# Patient Record
Sex: Male | Born: 1945 | Race: White | Hispanic: No | Smoking: Former smoker
Health system: Southern US, Community
[De-identification: ages and names within clinical notes are randomized; demographics above are authoritative.]

## PROBLEM LIST (undated history)

## (undated) DIAGNOSIS — I2510 Atherosclerotic heart disease of native coronary artery without angina pectoris: Secondary | ICD-10-CM

## (undated) DIAGNOSIS — N189 Chronic kidney disease, unspecified: Secondary | ICD-10-CM

## (undated) DIAGNOSIS — I4891 Unspecified atrial fibrillation: Secondary | ICD-10-CM

## (undated) DIAGNOSIS — I872 Venous insufficiency (chronic) (peripheral): Secondary | ICD-10-CM

## (undated) DIAGNOSIS — I509 Heart failure, unspecified: Secondary | ICD-10-CM

## (undated) DIAGNOSIS — I472 Ventricular tachycardia, unspecified: Secondary | ICD-10-CM

## (undated) DIAGNOSIS — D696 Thrombocytopenia, unspecified: Secondary | ICD-10-CM

## (undated) DIAGNOSIS — I10 Essential (primary) hypertension: Secondary | ICD-10-CM

## (undated) DIAGNOSIS — M1990 Unspecified osteoarthritis, unspecified site: Secondary | ICD-10-CM

## (undated) DIAGNOSIS — N200 Calculus of kidney: Secondary | ICD-10-CM

## (undated) DIAGNOSIS — E119 Type 2 diabetes mellitus without complications: Secondary | ICD-10-CM

## (undated) DIAGNOSIS — G4733 Obstructive sleep apnea (adult) (pediatric): Secondary | ICD-10-CM

## (undated) DIAGNOSIS — E785 Hyperlipidemia, unspecified: Secondary | ICD-10-CM

## (undated) HISTORY — DX: Type 2 diabetes mellitus without complications: E11.9

## (undated) HISTORY — DX: Unspecified atrial fibrillation: I48.91

## (undated) HISTORY — DX: Atherosclerotic heart disease of native coronary artery without angina pectoris: I25.10

## (undated) HISTORY — PX: KNEE SURGERY: SHX244

## (undated) HISTORY — DX: Essential (primary) hypertension: I10

## (undated) HISTORY — DX: Morbid (severe) obesity due to excess calories: E66.01

## (undated) HISTORY — DX: Venous insufficiency (chronic) (peripheral): I87.2

## (undated) HISTORY — DX: Heart failure, unspecified: I50.9

## (undated) HISTORY — DX: Hyperlipidemia, unspecified: E78.5

## (undated) HISTORY — DX: Thrombocytopenia, unspecified: D69.6

## (undated) HISTORY — DX: Calculus of kidney: N20.0

## (undated) HISTORY — DX: Obstructive sleep apnea (adult) (pediatric): G47.33

## (undated) HISTORY — DX: Unspecified osteoarthritis, unspecified site: M19.90

## (undated) HISTORY — DX: Chronic kidney disease, unspecified: N18.9

---

## 2003-08-29 HISTORY — PX: CARDIAC DEFIBRILLATOR PLACEMENT: SHX171

## 2006-04-25 ENCOUNTER — Emergency Department: Payer: Self-pay | Admitting: Emergency Medicine

## 2006-04-25 ENCOUNTER — Other Ambulatory Visit: Payer: Self-pay

## 2007-01-11 ENCOUNTER — Emergency Department: Payer: Self-pay | Admitting: Emergency Medicine

## 2007-01-11 ENCOUNTER — Other Ambulatory Visit: Payer: Self-pay

## 2007-07-16 ENCOUNTER — Ambulatory Visit: Payer: Self-pay | Admitting: Family

## 2008-02-20 ENCOUNTER — Ambulatory Visit: Payer: Self-pay | Admitting: Family

## 2008-08-19 ENCOUNTER — Ambulatory Visit: Payer: Self-pay | Admitting: Family

## 2008-11-12 ENCOUNTER — Ambulatory Visit: Payer: Self-pay | Admitting: Family

## 2010-06-10 ENCOUNTER — Emergency Department: Payer: Self-pay | Admitting: Emergency Medicine

## 2011-08-01 IMAGING — US ABDOMEN ULTRASOUND
1 series · 17 of 25 positions shown · non-contrast
Comparison: none

REASON FOR EXAM: epigastric and RUQ pain
COMMENTS:   May transport without cardiac monitor

PROCEDURE:     US  - US ABDOMEN GENERAL SURVEY  - June 10, 2010 [DATE]
RESULT:     Comparison: None
TECHNIQUE: Multiple gray-scale and color-flow Doppler images of the abdomen
are presented for review.

[Series 1: abdomen ultrasound · 17 of 49 slices shown]
[im 1/49]
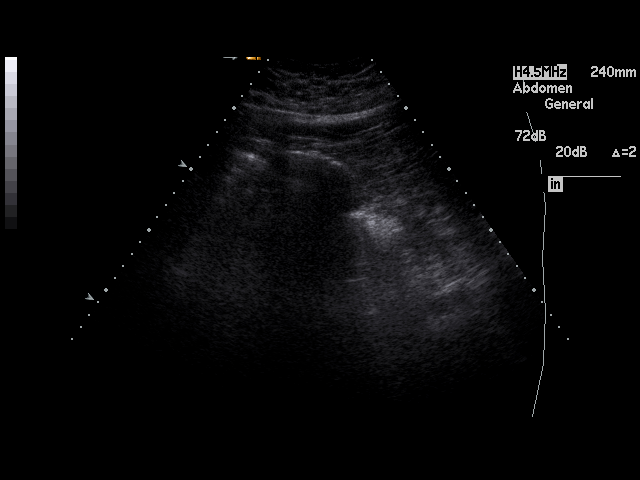
[im 5/49]
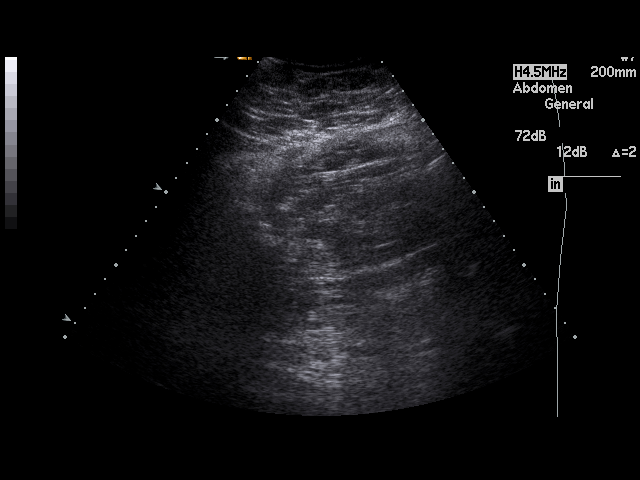
[im 7/49]
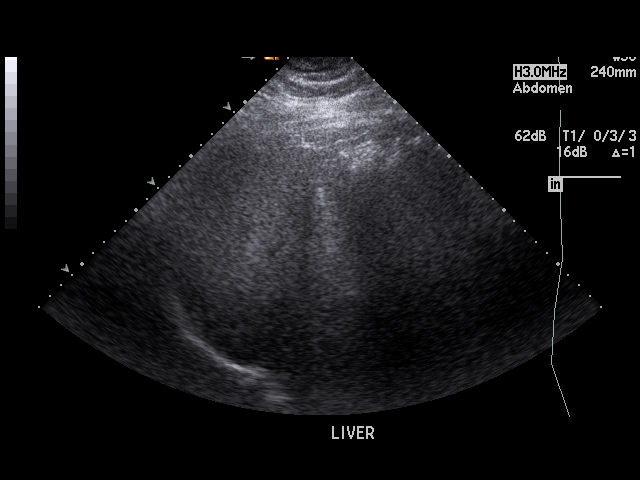
[im 11/49]
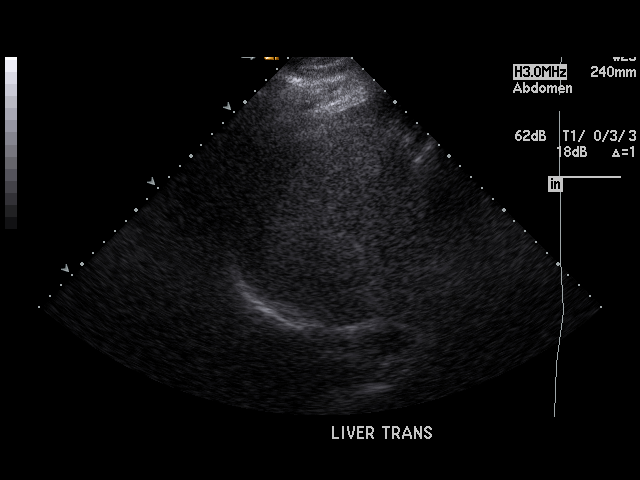
[im 13/49]
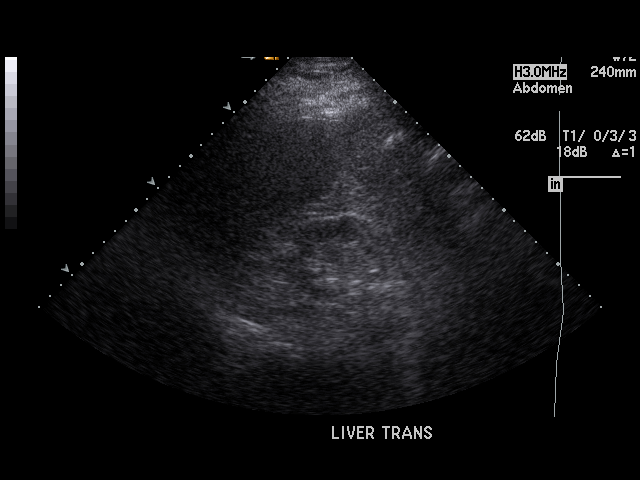
[im 17/49]
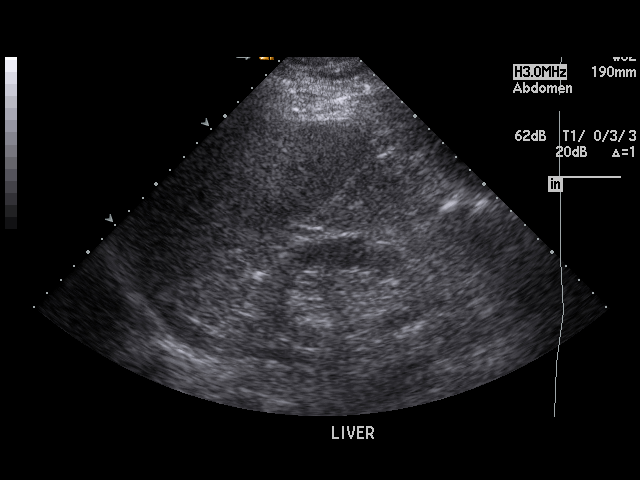
[im 19/49]
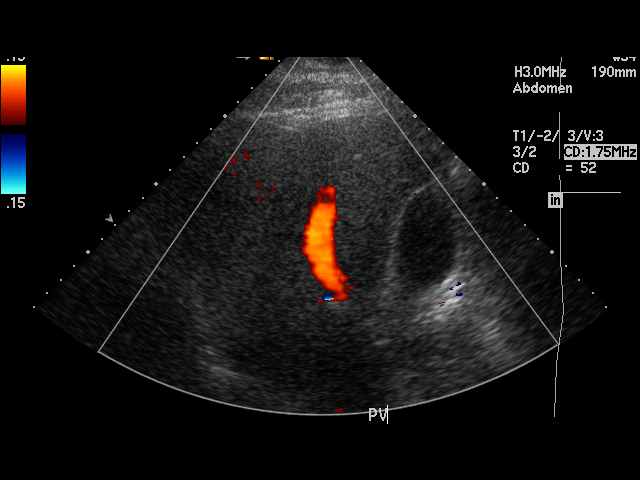
[im 23/49]
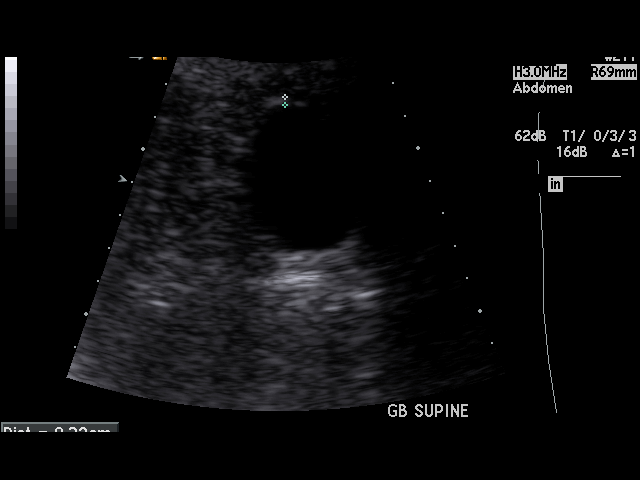
[im 25/49]
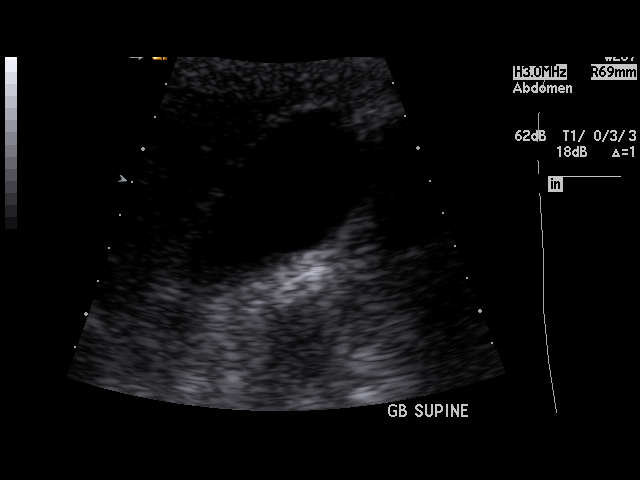
[im 27/49]
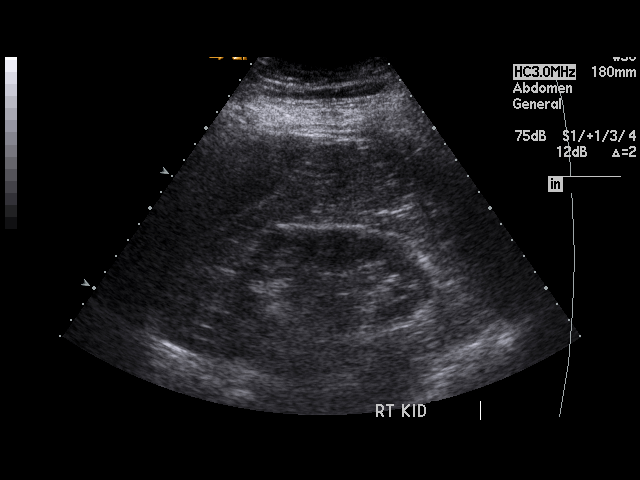
[im 31/49]
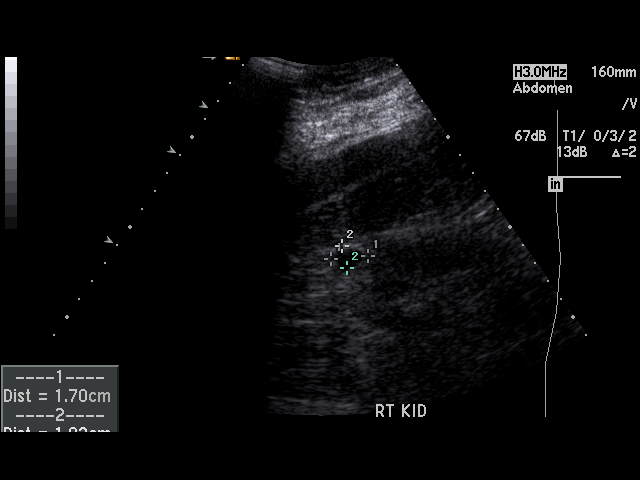
[im 33/49]
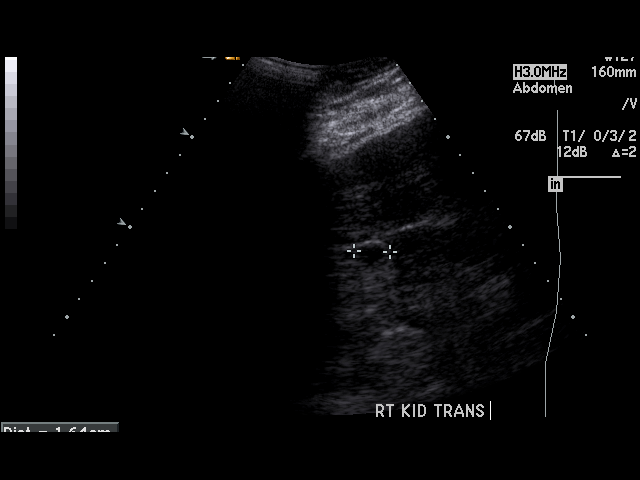
[im 37/49]
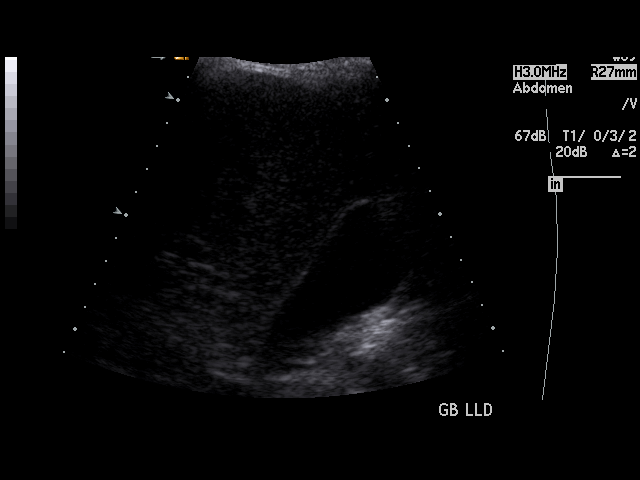
[im 39/49]
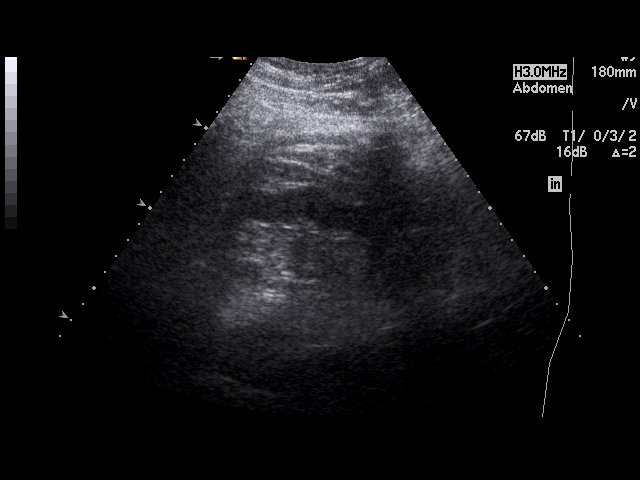
[im 43/49]
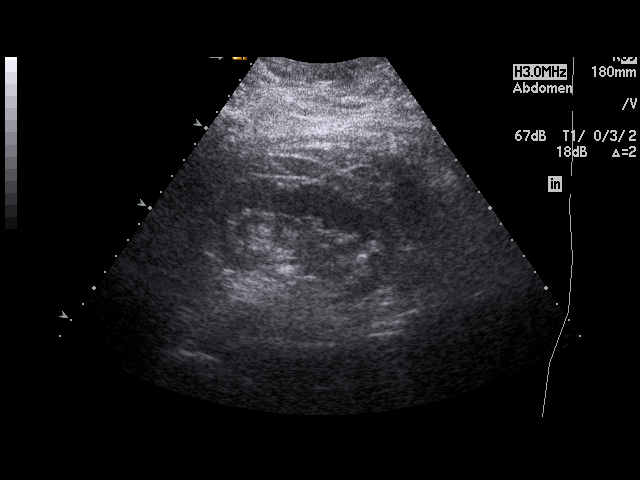
[im 45/49]
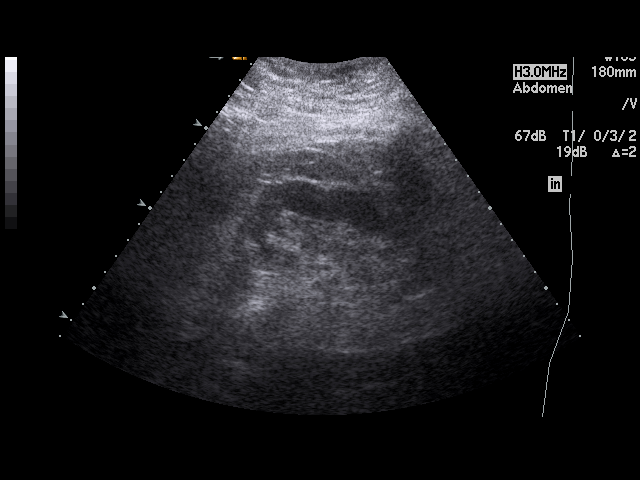
[im 49/49]
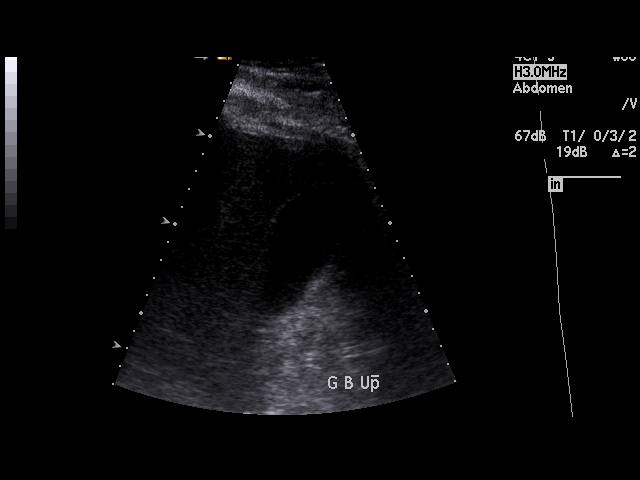

[17 of 25 positions shown; findings below may reference images not displayed]

FINDINGS: The examination is limited by body habitus.

The liver is suboptimally visualized secondary to body habitus. There is no
gross abnormality.

There is no cholelithiasis or biliary sludge. There is no intra- or
extrahepatic biliary ductal dilatation. The common duct measures 3.4 mm in
maximal diameter. There is no gallbladder wall thickening, pericholecystic
fluid, or sonographic Murphy's sign.

The pancreas is not visualized secondary to body habitus. The spleen is
unremarkable. Bilateral kidneys are normal in echogenicity and size. The
right kidney measures 11.1 x 4.5 x 5.5 cm. The left kidney measures 10.2 x
5.4 x 5 cm. There is a nonobstructing left renal calculus. There is no
obstructive uropathy. There is an anechoic 17 mm right renal mass with
increased through transmission most consistent with a cyst.  The abdominal
aorta and IVC are not visualized secondary to body habitus..
IMPRESSION: Limited examination of the abdomen secondary to body habitus.

1. Nonobstructing left nephrolithiasis.
2. No cholelithiasis.

## 2013-06-02 ENCOUNTER — Encounter: Payer: Self-pay | Admitting: Podiatry

## 2013-06-02 ENCOUNTER — Ambulatory Visit (INDEPENDENT_AMBULATORY_CARE_PROVIDER_SITE_OTHER): Payer: Medicare Other | Admitting: Podiatry

## 2013-06-02 VITALS — BP 105/68 | HR 72 | Temp 98.6°F | Resp 16 | Ht 69.0 in | Wt 318.0 lb

## 2013-06-02 DIAGNOSIS — M79606 Pain in leg, unspecified: Secondary | ICD-10-CM | POA: Insufficient documentation

## 2013-06-02 DIAGNOSIS — M79609 Pain in unspecified limb: Secondary | ICD-10-CM

## 2013-06-02 DIAGNOSIS — B351 Tinea unguium: Secondary | ICD-10-CM

## 2013-06-02 NOTE — Patient Instructions (Signed)
Follow up with [place] in two months

## 2013-06-02 NOTE — Progress Notes (Signed)
Kirt presents today with a chief complaint of painful toenails one through 5 bilaterally. He states that in the lower cut them. He presents with an antalgic gate and a walker.  Objective: Pulses are palpable but diminished capillary fill time is normal. No ulcerative lesions. Nails are thick yellow and dystrophic clinically mycotic and ingrown. There are painful on palpation as well as debridement.  Assessment: Pain and limp secondary to onychomycosis.  Plan: Debridement nails 1 through 5 bilaterally covered service.

## 2013-08-04 ENCOUNTER — Ambulatory Visit: Payer: Medicare Other | Admitting: Podiatry

## 2013-10-13 ENCOUNTER — Ambulatory Visit (INDEPENDENT_AMBULATORY_CARE_PROVIDER_SITE_OTHER): Payer: Medicare Other | Admitting: Podiatry

## 2013-10-13 VITALS — BP 80/48 | HR 84 | Resp 24 | Ht 64.0 in | Wt 298.0 lb

## 2013-10-13 DIAGNOSIS — M79609 Pain in unspecified limb: Secondary | ICD-10-CM

## 2013-10-13 DIAGNOSIS — B351 Tinea unguium: Secondary | ICD-10-CM

## 2013-10-13 NOTE — Progress Notes (Signed)
Chief complaint is painful toenails one through 5 bilateral.  Objective: Pulses are strongly palpable bilateral. Nails are thick yellow dystrophic and mycotic bilateral.  Assessment: Pain in limb secondary to onychomycosis 1 through 5 bilateral.  Plan: Debridement of nails 1 through 5 bilateral covered service secondary to pain

## 2014-01-12 ENCOUNTER — Ambulatory Visit (INDEPENDENT_AMBULATORY_CARE_PROVIDER_SITE_OTHER): Payer: Medicare Other | Admitting: Podiatry

## 2014-01-12 ENCOUNTER — Encounter: Payer: Self-pay | Admitting: Podiatry

## 2014-01-12 VITALS — BP 118/86 | HR 92 | Resp 20

## 2014-01-12 DIAGNOSIS — M79609 Pain in unspecified limb: Secondary | ICD-10-CM

## 2014-01-12 DIAGNOSIS — B351 Tinea unguium: Secondary | ICD-10-CM

## 2014-01-12 NOTE — Progress Notes (Signed)
He presents today with a chief complaint of painful elongated toenails one through 5 bilateral. He states tha a hang  in his socks  Objective: Nails are thick yellow dystrophic onychomycotic pulses are palpable bilateral. Edema bilateral.  Assessment: Pain in limb secondary to edema as well as to onychomycosis.  Plan: Debridement of nails in thickness and length as cover service secondary to pain.

## 2014-04-13 ENCOUNTER — Ambulatory Visit: Payer: Medicare Other | Admitting: Podiatry

## 2014-04-21 ENCOUNTER — Inpatient Hospital Stay: Payer: Self-pay | Admitting: Internal Medicine

## 2014-04-21 LAB — CBC WITH DIFFERENTIAL/PLATELET
Basophil #: 0.1 10*3/uL (ref 0.0–0.1)
Basophil %: 0.6 %
EOS ABS: 0 10*3/uL (ref 0.0–0.7)
Eosinophil %: 0.6 %
HCT: 44.6 % (ref 40.0–52.0)
HGB: 14 g/dL (ref 13.0–18.0)
LYMPHS ABS: 1.1 10*3/uL (ref 1.0–3.6)
Lymphocyte %: 12.6 %
MCH: 29.1 pg (ref 26.0–34.0)
MCHC: 31.5 g/dL — ABNORMAL LOW (ref 32.0–36.0)
MCV: 93 fL (ref 80–100)
MONO ABS: 0.9 x10 3/mm (ref 0.2–1.0)
Monocyte %: 10.2 %
Neutrophil #: 6.5 10*3/uL (ref 1.4–6.5)
Neutrophil %: 76 %
Platelet: 38 10*3/uL — ABNORMAL LOW (ref 150–440)
RBC: 4.81 10*6/uL (ref 4.40–5.90)
RDW: 18.2 % — ABNORMAL HIGH (ref 11.5–14.5)
WBC: 8.6 10*3/uL (ref 3.8–10.6)

## 2014-04-21 LAB — COMPREHENSIVE METABOLIC PANEL
ALT: 38 U/L
AST: 46 U/L — AB (ref 15–37)
Albumin: 3 g/dL — ABNORMAL LOW (ref 3.4–5.0)
Alkaline Phosphatase: 115 U/L
Anion Gap: 11 (ref 7–16)
BILIRUBIN TOTAL: 1.5 mg/dL — AB (ref 0.2–1.0)
BUN: 40 mg/dL — ABNORMAL HIGH (ref 7–18)
Calcium, Total: 9.6 mg/dL (ref 8.5–10.1)
Chloride: 90 mmol/L — ABNORMAL LOW (ref 98–107)
Co2: 30 mmol/L (ref 21–32)
Creatinine: 2.26 mg/dL — ABNORMAL HIGH (ref 0.60–1.30)
EGFR (African American): 33 — ABNORMAL LOW
GFR CALC NON AF AMER: 28 — AB
GLUCOSE: 178 mg/dL — AB (ref 65–99)
Osmolality: 277 (ref 275–301)
POTASSIUM: 5.8 mmol/L — AB (ref 3.5–5.1)
SODIUM: 131 mmol/L — AB (ref 136–145)
TOTAL PROTEIN: 6.9 g/dL (ref 6.4–8.2)

## 2014-04-21 LAB — CK TOTAL AND CKMB (NOT AT ARMC)
CK, Total: 64 U/L
CK, Total: 58 U/L
CK-MB: 5.8 ng/mL — ABNORMAL HIGH (ref 0.5–3.6)
CK-MB: 5.4 ng/mL — ABNORMAL HIGH (ref 0.5–3.6)

## 2014-04-21 LAB — URINALYSIS, COMPLETE
BACTERIA: NONE SEEN
Bilirubin,UR: NEGATIVE
Blood: NEGATIVE
GLUCOSE, UR: NEGATIVE mg/dL (ref 0–75)
Ketone: NEGATIVE
Leukocyte Esterase: NEGATIVE
Nitrite: NEGATIVE
Ph: 7 (ref 4.5–8.0)
Protein: NEGATIVE
RBC,UR: 1 /HPF (ref 0–5)
Specific Gravity: 1.011 (ref 1.003–1.030)
Squamous Epithelial: <1
WBC UR: 4 /HPF (ref 0–5)

## 2014-04-21 LAB — PROTIME-INR
INR: 2.8
PROTHROMBIN TIME: 29.1 s — AB (ref 11.5–14.7)

## 2014-04-21 LAB — TROPONIN I
TROPONIN-I: 0.12 ng/mL — AB
Troponin-I: 0.13 ng/mL — ABNORMAL HIGH
Troponin-I: 0.13 ng/mL — ABNORMAL HIGH

## 2014-04-21 LAB — POTASSIUM: POTASSIUM: 4.1 mmol/L (ref 3.5–5.1)

## 2014-04-22 LAB — CBC WITH DIFFERENTIAL/PLATELET
Basophil #: 0 10*3/uL (ref 0.0–0.1)
Basophil %: 0.5 %
Eosinophil #: 0.2 10*3/uL (ref 0.0–0.7)
Eosinophil %: 1.9 %
HCT: 40.5 % (ref 40.0–52.0)
HGB: 12.9 g/dL — ABNORMAL LOW (ref 13.0–18.0)
LYMPHS ABS: 1.4 10*3/uL (ref 1.0–3.6)
Lymphocyte %: 17.3 %
MCH: 29.2 pg (ref 26.0–34.0)
MCHC: 31.7 g/dL — ABNORMAL LOW (ref 32.0–36.0)
MCV: 92 fL (ref 80–100)
MONO ABS: 0.7 x10 3/mm (ref 0.2–1.0)
Monocyte %: 9.2 %
NEUTROS PCT: 71.1 %
Neutrophil #: 5.6 10*3/uL (ref 1.4–6.5)
Platelet: 39 10*3/uL — ABNORMAL LOW (ref 150–440)
RBC: 4.41 10*6/uL (ref 4.40–5.90)
RDW: 18.3 % — ABNORMAL HIGH (ref 11.5–14.5)
WBC: 7.8 10*3/uL (ref 3.8–10.6)

## 2014-04-22 LAB — BASIC METABOLIC PANEL
ANION GAP: 11 (ref 7–16)
BUN: 41 mg/dL — ABNORMAL HIGH (ref 7–18)
Calcium, Total: 9.4 mg/dL (ref 8.5–10.1)
Chloride: 92 mmol/L — ABNORMAL LOW (ref 98–107)
Co2: 30 mmol/L (ref 21–32)
Creatinine: 2.14 mg/dL — ABNORMAL HIGH (ref 0.60–1.30)
EGFR (Non-African Amer.): 30 — ABNORMAL LOW
GFR CALC AF AMER: 35 — AB
GLUCOSE: 96 mg/dL (ref 65–99)
OSMOLALITY: 276 (ref 275–301)
POTASSIUM: 3.9 mmol/L (ref 3.5–5.1)
SODIUM: 133 mmol/L — AB (ref 136–145)

## 2014-04-22 LAB — PROTEIN / CREATININE RATIO, URINE
CREATININE, URINE: 76.5 mg/dL (ref 30.0–125.0)
PROTEIN/CREAT. RATIO: 379 mg/g{creat} — AB (ref 0–200)
Protein, Random Urine: 29 mg/dL — ABNORMAL HIGH (ref 0–12)

## 2014-04-22 LAB — TSH: Thyroid Stimulating Horm: 2.72 u[IU]/mL

## 2014-04-22 LAB — PROTIME-INR
INR: 3.1
Prothrombin Time: 31.3 secs — ABNORMAL HIGH (ref 11.5–14.7)

## 2014-04-22 LAB — MAGNESIUM: MAGNESIUM: 2.3 mg/dL

## 2014-04-22 LAB — HEMOGLOBIN A1C: HEMOGLOBIN A1C: 7.7 % — AB (ref 4.2–6.3)

## 2014-04-23 LAB — BASIC METABOLIC PANEL
Anion Gap: 9 (ref 7–16)
BUN: 43 mg/dL — AB (ref 7–18)
Calcium, Total: 9.2 mg/dL (ref 8.5–10.1)
Chloride: 94 mmol/L — ABNORMAL LOW (ref 98–107)
Co2: 29 mmol/L (ref 21–32)
Creatinine: 2.16 mg/dL — ABNORMAL HIGH (ref 0.60–1.30)
EGFR (African American): 35 — ABNORMAL LOW
EGFR (Non-African Amer.): 30 — ABNORMAL LOW
Glucose: 115 mg/dL — ABNORMAL HIGH (ref 65–99)
Osmolality: 276 (ref 275–301)
Potassium: 4.2 mmol/L (ref 3.5–5.1)
Sodium: 132 mmol/L — ABNORMAL LOW (ref 136–145)

## 2014-04-23 LAB — PROTIME-INR
INR: 4.1
PROTHROMBIN TIME: 38.2 s — AB (ref 11.5–14.7)

## 2014-04-24 ENCOUNTER — Other Ambulatory Visit: Payer: Self-pay | Admitting: *Deleted

## 2014-04-24 LAB — UR PROT ELECTROPHORESIS, URINE RANDOM

## 2014-04-24 MED ORDER — HYDROCODONE-ACETAMINOPHEN 5-325 MG PO TABS: 1.0000 | ORAL_TABLET | Freq: Three times a day (TID) | ORAL | Status: DC | PRN

## 2014-04-24 NOTE — Telephone Encounter (Signed)
[facility] 

## 2014-04-27 ENCOUNTER — Non-Acute Institutional Stay (SKILLED_NURSING_FACILITY): Payer: Medicare Other | Admitting: Adult Health

## 2014-04-27 ENCOUNTER — Encounter: Payer: Self-pay | Admitting: Adult Health

## 2014-04-27 DIAGNOSIS — E785 Hyperlipidemia, unspecified: Secondary | ICD-10-CM

## 2014-04-27 DIAGNOSIS — E1159 Type 2 diabetes mellitus with other circulatory complications: Secondary | ICD-10-CM

## 2014-04-27 DIAGNOSIS — G609 Hereditary and idiopathic neuropathy, unspecified: Secondary | ICD-10-CM

## 2014-04-27 DIAGNOSIS — J309 Allergic rhinitis, unspecified: Secondary | ICD-10-CM | POA: Insufficient documentation

## 2014-04-27 DIAGNOSIS — I872 Venous insufficiency (chronic) (peripheral): Secondary | ICD-10-CM | POA: Insufficient documentation

## 2014-04-27 DIAGNOSIS — I4891 Unspecified atrial fibrillation: Secondary | ICD-10-CM

## 2014-04-27 DIAGNOSIS — I482 Chronic atrial fibrillation, unspecified: Secondary | ICD-10-CM

## 2014-04-27 DIAGNOSIS — Z7901 Long term (current) use of anticoagulants: Secondary | ICD-10-CM

## 2014-04-27 DIAGNOSIS — I509 Heart failure, unspecified: Secondary | ICD-10-CM

## 2014-04-27 DIAGNOSIS — K219 Gastro-esophageal reflux disease without esophagitis: Secondary | ICD-10-CM

## 2014-04-27 DIAGNOSIS — I2510 Atherosclerotic heart disease of native coronary artery without angina pectoris: Secondary | ICD-10-CM | POA: Insufficient documentation

## 2014-04-27 DIAGNOSIS — G629 Polyneuropathy, unspecified: Secondary | ICD-10-CM | POA: Insufficient documentation

## 2014-04-27 DIAGNOSIS — I8310 Varicose veins of unspecified lower extremity with inflammation: Secondary | ICD-10-CM

## 2014-04-27 MED ORDER — INSULIN ASPART 100 UNIT/ML ~~LOC~~ SOLN: 5.0000 [IU] | Freq: Three times a day (TID) | SUBCUTANEOUS | Status: AC

## 2014-04-27 MED ORDER — INSULIN GLARGINE 100 UNIT/ML ~~LOC~~ SOLN: 10.0000 [IU] | Freq: Every day | SUBCUTANEOUS | Status: AC

## 2014-04-27 NOTE — Progress Notes (Signed)
Patient ID: Justin Davis, male   DOB: Mar 27, 1946, 68 y.o.   MRN: [id number]    [facility]  Allergies  Allergen Reactions  . Carvedilol Diarrhea  . Ciprofloxacin Other (See Comments)    RESPIRATORY DISTRESS  . Clindamycin/Lincomycin Swelling and Other (See Comments)    CHEST PAIN  . Percodan [Oxycodone-Aspirin] Hives and Nausea And Vomiting  . Spironolactone Other (See Comments)    Stopped pores in breast    .  Chief Complaint  Patient presents with  . Hospitalization Follow-up    HPI:  He has been hospitalized for his congestive heart failure with an ef of 25-30%; bilateral lower extremity cellulitis with chronic venous stasis ulcerations; acute on chronic renal failure; diabetes and weaknesses. He is here for short term rehab; wound management with his goal to return back home when he is able.    Past Medical History  Diagnosis Date  . Diabetes   . Swelling     FEET AND LEGS  . Arthritis   . HBP (high blood pressure)   . Bruises easily   . CHF (congestive heart failure)   . CAD (coronary artery disease)   . Chronic kidney disease   . Hyperlipidemia   . A-fib   . Morbid obesity   . Obstructive sleep apnea   . Chronic venous stasis dermatitis of both lower extremities   . Thrombocytopenia   . Left nephrolithiasis      Past Surgical History  Procedure Laterality Date  . Cardiac surgery    . Knee surgery Bilateral   . Cardiac defibrillator placement      VITAL SIGNS BP 118/72  Pulse 74  Ht 5\' 4"  (1.626 m)  Wt 292 lb 4.8 oz (132.586 kg)  BMI 50.15 kg/m2   Patient's Medications  New Prescriptions   No medications on file  Previous Medications   ALBUTEROL (PROVENTIL HFA;VENTOLIN HFA) 108 (90 BASE) MCG/ACT INHALER    Inhale 2 puffs into the lungs 2 (two) times daily.    AMIODARONE (PACERONE) 200 MG TABLET    Take 200 mg by mouth daily.   ASPIRIN 81 MG TABLET    Take 81 mg by mouth daily.   DOCUSATE SODIUM (COLACE) 100 MG CAPSULE    Take 100 mg by  mouth 2 (two) times daily as needed for mild constipation.   FLUNISOLIDE (NASAREL) 29 MCG/ACT (0.025%) NASAL SPRAY    Place 2 sprays into the nose 2 (two) times daily. Dose is for each nostril.   FUROSEMIDE (LASIX) 40 MG TABLET    Take 20 mg by mouth. TAKE THREE TABLETS BY MOUTH EVERY MORNING AND TAKE THREE TABLETS EVERY NIGHT   GABAPENTIN (NEURONTIN) 400 MG CAPSULE    Take 600 mg by mouth 3 (three) times daily.    GLIPIZIDE (GLUCOTROL XL) 2.5 MG 24 HR TABLET    Take 2.5 mg by mouth daily with breakfast.   HYDROCODONE-ACETAMINOPHEN (NORCO/VICODIN) 5-325 MG PER TABLET    Take 1 tablet by mouth every 8 (eight) hours as needed.   LORATADINE (CLARITIN) 10 MG TABLET    Take 10 mg by mouth daily.   NITROGLYCERIN (NITROSTAT) 0.4 MG SL TABLET    Place 0.4 mg under the tongue every 5 (five) minutes as needed for chest pain.   OMEPRAZOLE (PRILOSEC) 20 MG CAPSULE    Take 20 mg by mouth daily.   ROSUVASTATIN (CRESTOR) 10 MG TABLET    Take 10 mg by mouth daily.   WARFARIN (COUMADIN) 5 MG TABLET  Take 3 mg by mouth daily at 6 PM.   Modified Medications   No medications on file  Discontinued Medications   ALCOHOL SWABS (ALCOHOL PREPS) PADS    by Does not apply route as needed.   ALLOPURINOL (ZYLOPRIM) 100 MG TABLET    Take 100 mg by mouth 3 (three) times daily.   ARTIFICIAL TEAR OINTMENT (LUBRICANT EYE OP)    Apply to eye. APPLY THIN RIBBON IN EACH EYE ONCE EVERY DAY   ASPIRIN 81 MG CHEWABLE TABLET    Chew 81 mg by mouth daily.   ATORVASTATIN (LIPITOR) 20 MG TABLET    Take 20 mg by mouth at bedtime. TAKE ONE AND ONE HALF TABLET   CETIRIZINE (ZYRTEC) 10 MG TABLET    Take 10 mg by mouth daily.   CHLORHEXIDINE (HIBICLENS) 4 % EXTERNAL LIQUID    APPLY AS DIRECTED TOPICALLY ONCE EVERY DAY USE TO SHOWER THE NIGHT BEFORE PROCEDURE AND THE MORNING OF PROCEDURE   CLOTRIMAZOLE (LOTRIMIN) 1 % CREAM    Apply topically 2 (two) times daily.   COLCHICINE 0.6 MG TABLET    TAKE TWO TABLETS BY MOUTH ONCE EVERY DAY FOR 1 DAY  AND TAKE ONE TABLET AT BEDTIME FOR 1 DAY, THEN TAKE ONE TABLET TWO TIMES A DAY FOR 5 DAYS   CYANOCOBALAMIN 1000 MCG TABLET    Take 100 mcg by mouth every other day.   CYCLOBENZAPRINE (FLEXERIL) 10 MG TABLET    TAKE TWO TABLETS BY MOUTH AT BEDTIME FOR MUSCLE SPASMS   DULOXETINE (CYMBALTA) 60 MG CAPSULE    Take 60 mg by mouth daily.   FLUTICASONE (FLONASE) 50 MCG/ACT NASAL SPRAY    Place 2 sprays into the nose 2 (two) times daily.   GAUZE PADS & DRESSINGS (TELFA NON-ADHERENT DRESSING) 3"X8" PADS    USE PAD TOPICALLY EVERY 12 HRS   GLIPIZIDE (GLUCOTROL) 5 MG TABLET    Take 5 mg by mouth. TAKE THREE TABLETS BY MOUTH TWO TIMES A DAY   GLUCOSE BLOOD (PRECISION XTRA TEST STRIPS) TEST STRIP    1 each by Other route as directed. Use as instructed   HYPROMELLOSE (ARTIFICIAL TEARS OP)    Apply 1 drop to eye 4 (four) times daily as needed.   INDOMETHACIN (INDOCIN) 50 MG CAPSULE    Take 50 mg by mouth 3 (three) times daily with meals.   INSULIN GLARGINE 100 UNIT/ML SOCT    Inject 26 Units into the skin at bedtime.   INSULIN SYRINGE-NEEDLE U-100 (INSULIN SYRINGE .5CC/31GX5/16") 31G X 5/16" 0.5 ML MISC    by Does not apply route daily.   LANCETS MISC    by Does not apply route. USE AS DIRECTED   LISINOPRIL (PRINIVIL,ZESTRIL) 20 MG TABLET    TAKE ONE-HALF TABLET BY MOUTH EVERY 12 HRS   METOLAZONE (ZAROXOLYN) 2.5 MG TABLET    TAKE ONE-HALF TABLET BY MOUTH ONCE EVERY DAY AS NEEDED WHEN  WEIGHT IS MORE THAN 300LBS   NORTRIPTYLINE (PAMELOR) 25 MG CAPSULE    Take 25 mg by mouth 3 (three) times daily.   OMEPRAZOLE (PRILOSEC) 20 MG CAPSULE    Take 20 mg by mouth 2 (two) times daily.   OXYCODONE (OXY-IR) 5 MG CAPSULE    TAKE ONE TO TWO TABLETS EVERY 6 HRS AS NEEDED FOR PAIN   POTASSIUM CHLORIDE (KCL) 2 MEQ/ML SOLN ORAL LIQUID    Take 1 mEq by mouth daily.   SODIUM CHLORIDE (OCEAN) 0.65 % NASAL SPRAY    Place 2 sprays  into the nose 3 (three) times daily.   VITAMIN D, ERGOCALCIFEROL, (DRISDOL) 50000 UNITS CAPS CAPSULE     Take 50,000 Units by mouth every 7 (seven) days.    SIGNIFICANT DIAGNOSTIC EXAMS  04-21-14: chest x-ray: worsening aeration compared with priors. AICD device grossly satisfactory position. Marked right hemidiaphragm elevation. Mild vascular congestion, worse than priors.   04-21-14: renal ultrasound: no hydronephrosis. Slightly increased echogenicity of the right kidney; left nephrolithiasis.   04-22-14: 2-d echo: ef 25-30%; moderate to severe left ventricle systolic dysfunction    LABS REVIEWED:   04-22-14: wbc 7.8; hgb 12.9; hct 40.5; mcv 92 plt 39; glucose 96; bun 41; creat 2.14; k+3.9; na++139 mag 2.3 tsh 2.72; hgb a1c 7.7 04-23-14: glucose 115; bun 43; creat 2.16; k+4.2; na++ 132     Review of Systems  Constitutional: Negative for malaise/fatigue.  Respiratory: Negative for cough and shortness of breath.   Cardiovascular: Positive for leg swelling. Negative for chest pain and palpitations.  Gastrointestinal: Negative for heartburn, abdominal pain and constipation.  Musculoskeletal: Positive for myalgias. Negative for joint pain.       Has chronic pain in lower extremities.   Skin:       Has chronic ulcers on lower extremities   Psychiatric/Behavioral: Negative for depression. The patient is not nervous/anxious.      Physical Exam  Constitutional: He is oriented to person, place, and time. He appears well-developed and well-nourished. No distress.  Morbidly obese   Neck: Neck supple. No JVD present.  Cardiovascular: Normal rate and intact distal pulses.   Murmur heard. Heart rate slightly irregular   Respiratory: Effort normal and breath sounds normal. No respiratory distress.  GI: Soft. Bowel sounds are normal. He exhibits no distension. There is no tenderness.  Musculoskeletal:  Is able to move all extremities; has lower extremity weakness; with mobility diminished due to his obesity.  Has trace bilateral lower extremity edema   Neurological: He is alert and oriented to  person, place, and time.  Skin: Skin is warm and dry. He is not diaphoretic.  Has bilateral ace wraps in place to lower extremities   Psychiatric: He has a normal mood and affect.      ASSESSMENT/ PLAN:  1. CHF, unspecified: he is presently stable; will continue daily weights; 1200 cc fluid restriction. Will continue lasix 20 mg daily; he is status post AICD placement; he is on chronic 02; will continue albuterol inhaler 2 puffs twice daily   2. Afib: his heart rate is stable; will continue amiodarone 200 mg daily for rate control with asa 81 mg daily; is on coumadin therapy.   3. Anticoagulation management: for his inr of 2.5 will continue his coumadin 3 mg daily and will check inr in one week   4. Dyslipidemia: will continue crestor 10 mg daily   5. CAD: no complaints of chest pain present; will continue asa 81 mg daily; will continue ntg prn for chest pain and will monitor his status.   6. Peripheral neuropathy: he is presently stable will continue neurontin 600 mg three times daily will monitor   7. Chronic venous stasis ulcerations: will continue current treatment and will continue vicodin 5/325 mg every 8 hours as needed and will monitor   8. Diabetes: he is presently on glipizide xl 2.5 mg daily; his readings are elevated will being lantus 10 units nightly and will begin novolog 5 units prior to meals for cbg >=150 and will monitor his status; his hgb a1c is 7.7  9. Allergic rhinitis: he is presently stable will continue claritin daily and will continue flunisolide nasal spray twice daily   10. Genella Rife: will continue prilosec 20 mg daily through 05-24-14 and will monitor    Will check cbc and cmp   Time spent with patient 50 minutes.     Synthia Innocent NP [facility]  Contact [phone] Monday through Friday 8am- 5pm  After hours call [phone]

## 2014-04-28 LAB — PROTEIN ELECTROPHORESIS(ARMC)

## 2014-04-29 ENCOUNTER — Non-Acute Institutional Stay (SKILLED_NURSING_FACILITY): Payer: Medicare Other | Admitting: Adult Health

## 2014-04-29 DIAGNOSIS — I8310 Varicose veins of unspecified lower extremity with inflammation: Secondary | ICD-10-CM

## 2014-04-29 DIAGNOSIS — I509 Heart failure, unspecified: Secondary | ICD-10-CM

## 2014-04-29 DIAGNOSIS — I872 Venous insufficiency (chronic) (peripheral): Secondary | ICD-10-CM

## 2014-04-30 ENCOUNTER — Other Ambulatory Visit: Payer: Self-pay

## 2014-04-30 ENCOUNTER — Non-Acute Institutional Stay: Payer: Medicare Other | Admitting: Adult Health Nurse Practitioner

## 2014-04-30 ENCOUNTER — Encounter: Payer: Self-pay | Admitting: Adult Health Nurse Practitioner

## 2014-04-30 ENCOUNTER — Non-Acute Institutional Stay (SKILLED_NURSING_FACILITY): Payer: Medicare Other | Admitting: Adult Health

## 2014-04-30 DIAGNOSIS — I509 Heart failure, unspecified: Secondary | ICD-10-CM

## 2014-04-30 LAB — CBC WITH DIFFERENTIAL/PLATELET
BASOS ABS: 0 10*3/uL (ref 0.0–0.1)
Basophil %: 0.6 %
EOS ABS: 0.1 10*3/uL (ref 0.0–0.7)
Eosinophil %: 1.4 %
HCT: 42.8 % (ref 40.0–52.0)
HGB: 13.7 g/dL (ref 13.0–18.0)
Lymphocyte #: 1 10*3/uL (ref 1.0–3.6)
Lymphocyte %: 15.5 %
MCH: 29 pg (ref 26.0–34.0)
MCHC: 32 g/dL (ref 32.0–36.0)
MCV: 91 fL (ref 80–100)
MONO ABS: 0.8 x10 3/mm (ref 0.2–1.0)
MONOS PCT: 11.9 %
NEUTROS ABS: 4.7 10*3/uL (ref 1.4–6.5)
NEUTROS PCT: 70.6 %
PLATELETS: 230 10*3/uL (ref 150–440)
RBC: 4.72 10*6/uL (ref 4.40–5.90)
RDW: 18.7 % — ABNORMAL HIGH (ref 11.5–14.5)
WBC: 6.7 10*3/uL (ref 3.8–10.6)

## 2014-04-30 LAB — BASIC METABOLIC PANEL
ANION GAP: 8 (ref 7–16)
BUN: 39 mg/dL — AB (ref 7–18)
Calcium, Total: 8.7 mg/dL (ref 8.5–10.1)
Chloride: 93 mmol/L — ABNORMAL LOW (ref 98–107)
Co2: 30 mmol/L (ref 21–32)
Creatinine: 1.64 mg/dL — ABNORMAL HIGH (ref 0.60–1.30)
EGFR (African American): 48 — ABNORMAL LOW
EGFR (Non-African Amer.): 42 — ABNORMAL LOW
Glucose: 98 mg/dL (ref 65–99)
Osmolality: 272 (ref 275–301)
POTASSIUM: 4.8 mmol/L (ref 3.5–5.1)
Sodium: 131 mmol/L — ABNORMAL LOW (ref 136–145)

## 2014-04-30 LAB — PRO B NATRIURETIC PEPTIDE: B-Type Natriuretic Peptide: 6678 pg/mL — ABNORMAL HIGH (ref 0–125)

## 2014-04-30 NOTE — Progress Notes (Signed)
PCP: No PCP Per Patient  Code Status: Full  Allergies  Allergen Reactions  . Carvedilol Diarrhea  . Ciprofloxacin Other (See Comments)    RESPIRATORY DISTRESS  . Clindamycin/Lincomycin Swelling and Other (See Comments)    CHEST PAIN  . Percodan [Oxycodone-Aspirin] Hives and Nausea And Vomiting  . Spironolactone Other (See Comments)    Stopped pores in breast    Chief Complaint: Shortness of breath   HPI:  Mr. Higinbotham is a 68yr old male with multiple chronic health conditions that is being seen today for increased shortness of breath and weight gain. He was seen 8/31 for shortness of breath, edema, and weight gain, his Lasix was increased to help pull fluid. Today his weight is up 3 lbs to 298.8lbs. He endorses being short of breath, difficulty breathing while talking, and increased edema to lower legs. His EF is only 25%. He denies chest pain, heart palpitations, and pain. He feels he is not responding well to the lasix. His O2 sats are low and he is requiring more oxygen at this time.  Review of Systems:  Constitutional: Negative for fever, chills, weight loss, malaise/fatigue and diaphoresis.  Respiratory: Positive shortness of breath. Negative for cough, sputum production, and wheezing.   Cardiovascular:Positive bilateral leg edema. Negative for chest pain, palpitations, orthopnea.   Neurological: Negative for weakness,dizziness, tingling, focal weakness and headaches.  Psychiatric/Behavioral: Negative for depression and memory loss. The patient is not nervous/anxious.     Past Medical History  Diagnosis Date  . Diabetes   . Swelling     FEET AND LEGS  . Arthritis   . HBP (high blood pressure)   . Bruises easily   . CHF (congestive heart failure)   . CAD (coronary artery disease)   . Chronic kidney disease   . Hyperlipidemia   . A-fib   . Morbid obesity   . Obstructive sleep apnea   . Chronic venous stasis dermatitis of both lower extremities   .  Thrombocytopenia   . Left nephrolithiasis    Past Surgical History  Procedure Laterality Date  . Cardiac surgery    . Knee surgery Bilateral   . Cardiac defibrillator placement     Social History:   reports that he quit smoking about 30 years ago. His smoking use included Cigarettes. He smoked 0.00 packs per day. He has never used smokeless tobacco. He reports that he does not drink alcohol or use illicit drugs.  History reviewed. No pertinent family history.  Medications:   Medication List       This list is accurate as of: 04/30/14  1:26 PM.  Always use your most recent med list.               albuterol 108 (90 BASE) MCG/ACT inhaler  Commonly known as:  PROVENTIL HFA;VENTOLIN HFA  Inhale 2 puffs into the lungs 2 (two) times daily.     amiodarone 200 MG tablet  Commonly known as:  PACERONE  Take 200 mg by mouth daily.     aspirin 81 MG tablet  Take 81 mg by mouth daily.     docusate sodium 100 MG capsule  Commonly known as:  COLACE  Take 100 mg by mouth 2 (two) times daily as needed for mild constipation.     flunisolide 29 MCG/ACT (0.025%) nasal spray  Commonly known as:  NASAREL  Place 2 sprays into the nose 2 (two) times daily. Dose is for each nostril.  furosemide 40 MG tablet  Commonly known as:  LASIX  Take 20 mg by mouth. TAKE THREE TABLETS BY MOUTH EVERY MORNING AND TAKE THREE TABLETS EVERY NIGHT     gabapentin 400 MG capsule  Commonly known as:  NEURONTIN  Take 600 mg by mouth 3 (three) times daily.     glipiZIDE 2.5 MG 24 hr tablet  Commonly known as:  GLUCOTROL XL  Take 2.5 mg by mouth daily with breakfast.     HYDROcodone-acetaminophen 5-325 MG per tablet  Commonly known as:  NORCO/VICODIN  Take 1 tablet by mouth every 8 (eight) hours as needed.     insulin aspart 100 UNIT/ML injection  Commonly known as:  novoLOG  Inject 5 Units into the skin 3 (three) times daily before meals. For cbg >=150     insulin glargine 100 UNIT/ML injection    Commonly known as:  LANTUS  Inject 0.1 mLs (10 Units total) into the skin at bedtime.     loratadine 10 MG tablet  Commonly known as:  CLARITIN  Take 10 mg by mouth daily.     nitroGLYCERIN 0.4 MG SL tablet  Commonly known as:  NITROSTAT  Place 0.4 mg under the tongue every 5 (five) minutes as needed for chest pain.     omeprazole 20 MG capsule  Commonly known as:  PRILOSEC  Take 20 mg by mouth daily.     rosuvastatin 10 MG tablet  Commonly known as:  CRESTOR  Take 10 mg by mouth daily.     warfarin 5 MG tablet  Commonly known as:  COUMADIN  Take 3 mg by mouth daily at 6 PM.          Physical Exam: Filed Vitals:   04/30/14 1318  BP: 113/84  Pulse: 70  Temp: 96.7 F (35.9 C)  Resp: 18   General- elderly male  Neck- no lymphadenopathy, no thyromegaly, no jugular vein distension, no carotid bruit Chest- no chest wall deformities, no chest wall tenderness Cardiovascular- Positive murmur; normal s1,s2, no rubs/ gallops Respiratory- bilateral upper lobes clear to auscultation, fine crackles to bilateral bases.  2+ pitting edema to lower extremities. Skin- warm and dry Psychiatry- alert and oriented to person, place and time, normal mood and affect    Labs reviewed:  SIGNIFICANT DIAGNOSTIC EXAMS  04-21-14: chest x-ray: worsening aeration compared with priors. AICD device grossly satisfactory position. Marked right hemidiaphragm elevation. Mild vascular congestion, worse than priors.   04-21-14: renal ultrasound: no hydronephrosis. Slightly increased echogenicity of the right kidney; left nephrolithiasis.   04-22-14: 2-d echo: ef 25-30%; moderate to severe left ventricle systolic dysfunction    LABS REVIEWED:   04-22-14: wbc 7.8; hgb 12.9; hct 40.5; mcv 92 plt 39; glucose 96; bun 41; creat 2.14; k+3.9; na++139 mag 2.3 tsh 2.72; hgb a1c 7.7 04-23-14: glucose 115; bun 43; creat 2.16; k+4.2; na++ 132   Assessment/Plan 1. CHF, acute exacerbation: His condition is  not improving even with increased Lasix to  PO BID Will obtain a chest x-ray, CMET, CBC, and BNP; will follow up with results Prescribed Zaroxolyn 2.5mg  PO tablet X one dose today Pt has poor renal function, will continue to monitor     Genesia Caslin, Jacques Earthly, Student-NP

## 2014-05-04 MED ORDER — FUROSEMIDE 40 MG PO TABS: 40.0000 mg | ORAL_TABLET | Freq: Every day | ORAL | Status: AC

## 2014-05-04 MED ORDER — OXYCODONE HCL 5 MG PO TABS: 5.0000 mg | ORAL_TABLET | Freq: Four times a day (QID) | ORAL | Status: AC | PRN

## 2014-05-04 NOTE — Progress Notes (Signed)
Patient ID: Justin Davis, male   DOB: [birth year], 68 y.o.   MRN: [id number]     [facility]  Allergies  Allergen Reactions  . Carvedilol Diarrhea  . Ciprofloxacin Other (See Comments)    RESPIRATORY DISTRESS  . Clindamycin/Lincomycin Swelling and Other (See Comments)    CHEST PAIN  . Spironolactone Other (See Comments)    Stopped pores in breast         Chief Complaint: Shortness of breath    HPI:   Justin Davis is a 68yr old male with multiple chronic health conditions that is being seen today for increased shortness of breath and weight gain. He was seen 8/31 for shortness of breath, edema, and weight gain, his Lasix was increased to help pull fluid. Today his weight is up 3 lbs to 298.8lbs. He endorses being short of breath, difficulty breathing while talking, and increased edema to lower legs. His EF is only 25%. He denies chest pain, heart palpitations, and pain. He feels he is not responding well to the lasix. His O2 sats are low and he is requiring more oxygen at this time.     Review of Systems  Constitutional: Positive for malaise/fatigue.  Respiratory: Positive for shortness of breath. Negative for cough and sputum production.   Cardiovascular: Positive for leg swelling. Negative for chest pain and palpitations.  Gastrointestinal: Negative for heartburn, abdominal pain and constipation.  Musculoskeletal: Positive for joint pain and myalgias.  Psychiatric/Behavioral: The patient is not nervous/anxious.        Past Medical History   Diagnosis  Date   .  Diabetes     .  Swelling         FEET AND LEGS   .  Arthritis     .  HBP (high blood pressure)     .  Bruises easily     .  CHF (congestive heart failure)     .  CAD (coronary artery disease)     .  Chronic kidney disease     .  Hyperlipidemia     .  A-fib     .  Morbid obesity     .  Obstructive sleep apnea     .  Chronic venous stasis dermatitis of both lower extremities     .  Thrombocytopenia     .  Left  nephrolithiasis        Past Surgical History   Procedure  Laterality  Date   .  Cardiac surgery       .  Knee surgery  Bilateral     .  Cardiac defibrillator placement        Social History:   reports that he quit smoking about 30 years ago. His smoking use included Cigarettes. He smoked 0.00 packs per day. He has never used smokeless tobacco. He reports that he does not drink alcohol or use illicit drugs.  History reviewed. No pertinent family history.  Medications:  Outpatient Encounter Prescriptions as of 04/30/2014  Medication Sig  . albuterol (PROVENTIL HFA;VENTOLIN HFA) 108 (90 BASE) MCG/ACT inhaler Inhale 2 puffs into the lungs 2 (two) times daily.   Marland Kitchen amiodarone (PACERONE) 200 MG tablet Take 200 mg by mouth daily.  Marland Kitchen aspirin 81 MG tablet Take 81 mg by mouth daily.  Marland Kitchen docusate sodium (COLACE) 100 MG capsule Take 100 mg by mouth 2 (two) times daily as needed for mild constipation.  . flunisolide (NASAREL) 29 MCG/ACT (0.025%) nasal spray Place 2 sprays  into the nose 2 (two) times daily. Dose is for each nostril.  . furosemide (LASIX) 40 MG tablet Take 1 tablet (40 mg total) by mouth daily.  Marland Kitchen gabapentin (NEURONTIN) 400 MG capsule Take 600 mg by mouth 3 (three) times daily.   Marland Kitchen glipiZIDE (GLUCOTROL XL) 2.5 MG 24 hr tablet Take 2.5 mg by mouth daily with breakfast.  . insulin aspart (NOVOLOG) 100 UNIT/ML injection Inject 5 Units into the skin 3 (three) times daily before meals. For cbg >=150  . insulin glargine (LANTUS) 100 UNIT/ML injection Inject 0.1 mLs (10 Units total) into the skin at bedtime.  Marland Kitchen loratadine (CLARITIN) 10 MG tablet Take 10 mg by mouth daily.  . nitroGLYCERIN (NITROSTAT) 0.4 MG SL tablet Place 0.4 mg under the tongue every 5 (five) minutes as needed for chest pain.  Marland Kitchen omeprazole (PRILOSEC) 20 MG capsule Take 20 mg by mouth daily.  Marland Kitchen oxyCODONE (OXY IR/ROXICODONE) 5 MG immediate release tablet Take 1 tablet (5 mg total) by mouth every 6 (six) hours as needed for  severe pain.  . rosuvastatin (CRESTOR) 10 MG tablet Take 10 mg by mouth daily.  Marland Kitchen warfarin (COUMADIN) 5 MG tablet Take 3 mg by mouth daily at 6 PM.      Physical Exam: Filed Vitals:     04/30/14 1318   BP:  113/84   Pulse:  70   Temp:  96.7 F (35.9 C)   Resp:  18    General- elderly male  obese Neck- no lymphadenopathy, no thyromegaly, no jugular vein distension, no carotid bruit Chest- no chest wall deformities, no chest wall tenderness Cardiovascular- Positive murmur; normal s1,s2, no rubs/ gallops Respiratory- bilateral upper lobes clear to auscultation, fine crackles to bilateral bases.  2+ pitting edema to lower extremities. Skin- warm and dry Psychiatry- alert and oriented to person, place and time, normal mood and affect     SIGNIFICANT DIAGNOSTIC EXAMS  04-21-14: chest x-ray: worsening aeration compared with priors. AICD device grossly satisfactory position. Marked right hemidiaphragm elevation. Mild vascular congestion, worse than priors.   04-21-14: renal ultrasound: no hydronephrosis. Slightly increased echogenicity of the right kidney; left nephrolithiasis.   04-22-14: 2-d echo: ef 25-30%; moderate to severe left ventricle systolic dysfunction    LABS REVIEWED:   04-22-14: wbc 7.8; hgb 12.9; hct 40.5; mcv 92 plt 39; glucose 96; bun 41; creat 2.14; k+3.9; na++139 mag 2.3 tsh 2.72; hgb a1c 7.7 04-23-14: glucose 115; bun 43; creat 2.16; k+4.2; na++ 132 04-28-14: wbc 7.0; hgb 13.3; hct 44.1 mcv 94.4; plt 177; glucose 226; bun 43; creat 1.5; k+4.4; na++133; liver normal albumin 3.4      Assessment/Plan 1. CHF, acute exacerbation: His condition is not improving even with increased Lasix to  PO daily Will obtain a chest x-ray, CMET, CBC, and BNP; will follow up with results Prescribed Zaroxolyn 2.5mg  PO tablet X one dose today Pt has poor renal function, will continue to monitor     [place], Jacques Earthly, Student-NP

## 2014-05-04 NOTE — Progress Notes (Signed)
Patient ID: Justin Davis, male   DOB: 1946/03/10, 68 y.o.   MRN: [id number]    [facility]  Allergies  Allergen Reactions  . Carvedilol Diarrhea  . Ciprofloxacin Other (See Comments)    RESPIRATORY DISTRESS  . Clindamycin/Lincomycin Swelling and Other (See Comments)    CHEST PAIN  . Percodan [Oxycodone-Aspirin] Hives and Nausea And Vomiting  . Spironolactone Other (See Comments)    Stopped pores in breast     Chief Complaint  Patient presents with  . Acute Visit    weight gain and pain management     HPI:  He has gained weight; his current weight is 295.6 pounds. His edema is worse. He states he has less energy and is less able to participate in therapy.  He denies any chest pain; no shortness of breath. He does complain of leg pain which is not being relieved with his vicodin. He states he was taking oxycodone at home.   Past Medical History  Diagnosis Date  . Diabetes   . Swelling     FEET AND LEGS  . Arthritis   . HBP (high blood pressure)   . Bruises easily   . CHF (congestive heart failure)   . CAD (coronary artery disease)   . Chronic kidney disease   . Hyperlipidemia   . A-fib   . Morbid obesity   . Obstructive sleep apnea   . Chronic venous stasis dermatitis of both lower extremities   . Thrombocytopenia   . Left nephrolithiasis     Past Surgical History  Procedure Laterality Date  . Cardiac surgery    . Knee surgery Bilateral   . Cardiac defibrillator placement      VITAL SIGNS BP 110/80  Pulse 72  Ht  (1.626 m)  Wt 295 lb 9.6 oz (134.083 kg)  BMI 50.71 kg/m2  SpO2 95%   Patient's Medications  New Prescriptions   No medications on file  Previous Medications   ALBUTEROL (PROVENTIL HFA;VENTOLIN HFA) 108 (90 BASE) MCG/ACT INHALER    Inhale 2 puffs into the lungs 2 (two) times daily.    AMIODARONE (PACERONE) 200 MG TABLET    Take 200 mg by mouth daily.   ASPIRIN 81 MG TABLET    Take 81 mg by mouth daily.   DOCUSATE SODIUM (COLACE) 100 MG  CAPSULE    Take 100 mg by mouth 2 (two) times daily as needed for mild constipation.   FLUNISOLIDE (NASAREL) 29 MCG/ACT (0.025%) NASAL SPRAY    Place 2 sprays into the nose 2 (two) times daily. Dose is for each nostril.   FUROSEMIDE (LASIX) 40 MG TABLET    Take 20 mg by mouth.daily    GABAPENTIN (NEURONTIN) 400 MG CAPSULE    Take 600 mg by mouth 3 (three) times daily.    GLIPIZIDE (GLUCOTROL XL) 2.5 MG 24 HR TABLET    Take 2.5 mg by mouth daily with breakfast.   HYDROCODONE-ACETAMINOPHEN (NORCO/VICODIN) 5-325 MG PER TABLET    Take 1 tablet by mouth every 8 (eight) hours as needed.   INSULIN ASPART (NOVOLOG) 100 UNIT/ML INJECTION    Inject 5 Units into the skin 3 (three) times daily before meals. For cbg >=150   INSULIN GLARGINE (LANTUS) 100 UNIT/ML INJECTION    Inject 0.1 mLs (10 Units total) into the skin at bedtime.   LORATADINE (CLARITIN) 10 MG TABLET    Take 10 mg by mouth daily.   NITROGLYCERIN (NITROSTAT) 0.4 MG SL TABLET  Place 0.4 mg under the tongue every 5 (five) minutes as needed for chest pain.   OMEPRAZOLE (PRILOSEC) 20 MG CAPSULE    Take 20 mg by mouth daily.   ROSUVASTATIN (CRESTOR) 10 MG TABLET    Take 10 mg by mouth daily.   WARFARIN (COUMADIN) 5 MG TABLET    Take 3 mg by mouth daily at 6 PM.   Modified Medications   No medications on file  Discontinued Medications   No medications on file    SIGNIFICANT DIAGNOSTIC EXAMS   04-21-14: chest x-ray: worsening aeration compared with priors. AICD device grossly satisfactory position. Marked right hemidiaphragm elevation. Mild vascular congestion, worse than priors.   04-21-14: renal ultrasound: no hydronephrosis. Slightly increased echogenicity of the right kidney; left nephrolithiasis.   04-22-14: 2-d echo: ef 25-30%; moderate to severe left ventricle systolic dysfunction    LABS REVIEWED:   04-22-14: wbc 7.8; hgb 12.9; hct 40.5; mcv 92 plt 39; glucose 96; bun 41; creat 2.14; k+3.9; na++139 mag 2.3 tsh 2.72; hgb a1c  7.7 04-23-14: glucose 115; bun 43; creat 2.16; k+4.2; na++ 132 04-28-14: wbc 7.0; hgb 13.3; hct 44.1 mcv 94.4; plt 177; glucose 226; bun 43; creat 1.5; k+4.4; na++133; liver normal albumin 3.4      Review of Systems  Constitutional: Negative for malaise/fatigue.  Respiratory: Negative for cough and shortness of breath.   Cardiovascular: Positive for leg swelling. Negative for chest pain and palpitations.  Gastrointestinal: Negative for heartburn, abdominal pain and constipation.  Musculoskeletal: Positive for myalgias. Negative for joint pain.       Has chronic pain in lower extremities.   Skin:       Has chronic ulcers on lower extremities   Psychiatric/Behavioral: Negative for depression. The patient is not nervous/anxious.      Physical Exam  Constitutional: He is oriented to person, place, and time. He appears well-developed and well-nourished. No distress.  Morbidly obese   Neck: Neck supple. No JVD present.  Cardiovascular: Normal rate and intact distal pulses.   Murmur heard. Heart rate slightly irregular   Respiratory: Effort normal and breath sounds normal. No respiratory distress.  GI: Soft. Bowel sounds are normal. He exhibits no distension. There is no tenderness.  Musculoskeletal:  Is able to move all extremities; has lower extremity weakness; with mobility diminished due to his obesity.  Has 3+ bilateral lower extremity edema   Neurological: He is alert and oriented to person, place, and time.  Skin: Skin is warm and dry. He is not diaphoretic.  Has bilateral ace wraps in place to lower extremities   Psychiatric: He has a normal mood and affect.      ASSESSMENT/ PLAN:  1. CHF, unspecified: is worse ; will continue daily weights; 1200 cc fluid restriction. Will increase lasix to 40 mg daily and will give him an extra 20 mg lasix today; he is status post AICD placement; he is on chronic 02; will continue albuterol inhaler 2 puffs twice daily    7. Chronic venous  stasis ulcerations: will continue current treatment; will stop the vicodin and will begin oxycodone 5 mg every 6 hours as needed and will monitor his status.       Synthia Innocent NP [facility]  Contact [phone] Monday through Friday 8am- 5pm  After hours call [phone]

## 2014-05-04 NOTE — Addendum Note (Signed)
Addended by: Sharee Holster on: 05/04/2014 08:50 PM   Modules accepted: Level of Service, SmartSet

## 2014-05-04 NOTE — Progress Notes (Signed)
This encounter was created in error - please disregard.

## 2014-05-05 ENCOUNTER — Encounter: Payer: Self-pay | Admitting: Adult Health Nurse Practitioner

## 2014-05-05 ENCOUNTER — Non-Acute Institutional Stay (SKILLED_NURSING_FACILITY): Payer: Medicare Other | Admitting: Adult Health

## 2014-05-05 DIAGNOSIS — I5042 Chronic combined systolic (congestive) and diastolic (congestive) heart failure: Secondary | ICD-10-CM

## 2014-05-05 DIAGNOSIS — I509 Heart failure, unspecified: Secondary | ICD-10-CM

## 2014-05-05 NOTE — Progress Notes (Signed)
PCP: No PCP Per Patient  Code Status: Full  Allergies  Allergen Reactions  . Carvedilol Diarrhea  . Ciprofloxacin Other (See Comments)    RESPIRATORY DISTRESS  . Clindamycin/Lincomycin Swelling and Other (See Comments)    CHEST PAIN  . Spironolactone Other (See Comments)    Stopped pores in breast    Chief Complaint: Acute visit follow up  HPI:  Following up with Justin Davis since last week. Pt was having increased SOB, leg swelling, fatigue/malaise, and weight gain. Pts weight is down today to 290.4, 8lbs since last Thursday. Currently patient is up in wheelchair and washing. Pt states he is feeling better. Swelling has improved, shortness of breath is back to baseline without exacerbation. Pt is able to participate in ADLs without fatigue. Pt denies chest pain or difficulty breathing.   Review of Systems:  Constitutional: Negative for fever, chills, malaise/fatigue and diaphoresis.  Respiratory: chronically short of breath, improved since last visit.  Negative for cough, sputum production, and wheezing.   Cardiovascular: Leg swelling improved. Negative for chest pain, palpitations, and orthopnea    Genitourinary: Negative for dysuria, urgency, frequency, hematuria and flank pain.  .     Past Medical History  Diagnosis Date  . Diabetes   . Swelling     FEET AND LEGS  . Arthritis   . HBP (high blood pressure)   . Bruises easily   . CHF (congestive heart failure)   . CAD (coronary artery disease)   . Chronic kidney disease   . Hyperlipidemia   . A-fib   . Morbid obesity   . Obstructive sleep apnea   . Chronic venous stasis dermatitis of both lower extremities   . Thrombocytopenia   . Left nephrolithiasis    Past Surgical History  Procedure Laterality Date  . Cardiac surgery    . Knee surgery Bilateral   . Cardiac defibrillator placement     Social History:   reports that he quit smoking about 30 years ago. His smoking use included Cigarettes. He smoked 0.00  packs per day. He has never used smokeless tobacco. He reports that he does not drink alcohol or use illicit drugs.  History reviewed. No pertinent family history.  Medications:   Medication List       This list is accurate as of: 05/05/14 11:22 AM.  Always use your most recent med list.               albuterol 108 (90 BASE) MCG/ACT inhaler  Commonly known as:  PROVENTIL HFA;VENTOLIN HFA  Inhale 2 puffs into the lungs 2 (two) times daily.     amiodarone 200 MG tablet  Commonly known as:  PACERONE  Take 200 mg by mouth daily.     aspirin 81 MG tablet  Take 81 mg by mouth daily.     docusate sodium 100 MG capsule  Commonly known as:  COLACE  Take 100 mg by mouth 2 (two) times daily as needed for mild constipation.     flunisolide 29 MCG/ACT (0.025%) nasal spray  Commonly known as:  NASAREL  Place 2 sprays into the nose 2 (two) times daily. Dose is for each nostril.     furosemide 40 MG tablet  Commonly known as:  LASIX  Take 1 tablet (40 mg total) by mouth daily.     gabapentin 400 MG capsule  Commonly known as:  NEURONTIN  Take 600 mg by mouth 3 (three) times daily.  glipiZIDE 2.5 MG 24 hr tablet  Commonly known as:  GLUCOTROL XL  Take 2.5 mg by mouth daily with breakfast.     insulin aspart 100 UNIT/ML injection  Commonly known as:  novoLOG  Inject 5 Units into the skin 3 (three) times daily before meals. For cbg >=150     insulin glargine 100 UNIT/ML injection  Commonly known as:  LANTUS  Inject 0.1 mLs (10 Units total) into the skin at bedtime.     loratadine 10 MG tablet  Commonly known as:  CLARITIN  Take 10 mg by mouth daily.     nitroGLYCERIN 0.4 MG SL tablet  Commonly known as:  NITROSTAT  Place 0.4 mg under the tongue every 5 (five) minutes as needed for chest pain.     omeprazole 20 MG capsule  Commonly known as:  PRILOSEC  Take 20 mg by mouth daily.     oxyCODONE 5 MG immediate release tablet  Commonly known as:  Oxy IR/ROXICODONE  Take 1  tablet (5 mg total) by mouth every 6 (six) hours as needed for severe pain.     rosuvastatin 10 MG tablet  Commonly known as:  CRESTOR  Take 10 mg by mouth daily.     warfarin 5 MG tablet  Commonly known as:  COUMADIN  Take 3 mg by mouth daily at 6 PM.          Physical Exam:  Filed Vitals:   05/05/14 1117  BP: 108/78  Pulse: 78  Temp: 96.5 F (35.8 C)  Resp: 20  Weight: 290 lb 6.4 oz (131.725 kg)  SpO2: 92%    General- elderly male in no acute distress Nose- normal nasal mucosa, no maxillary or frontal sinus tenderness Chest- no chest wall deformities, no chest wall tenderness Cardiovascular- normal s1,s2, no murmurs/ rubs/ gallops; 1-2+ pitting edema noted to bilateral legs Respiratory- bilateral clear to auscultation, no wheeze, no rhonchi, no crackles, no use of accessory muscles Abdomen- bowel sounds present, soft, non tender, no organomegaly, no abdominal bruits, no guarding or rigidity, no CVA tenderness Skin- warm and dry; bilateral compression wraps to legs.  Psychiatry- alert and oriented to person, place and time, normal mood and affect    Labs reviewed: SIGNIFICANT DIAGNOSTIC EXAMS  04-21-14: chest x-ray: worsening aeration compared with priors. AICD device grossly satisfactory position. Marked right hemidiaphragm elevation. Mild vascular congestion, worse than priors.   04-21-14: renal ultrasound: no hydronephrosis. Slightly increased echogenicity of the right kidney; left nephrolithiasis.   04-22-14: 2-d echo: ef 25-30%; moderate to severe left ventricle systolic dysfunction  04-30-14; chest x-ray, no acute findings; cardiomegaly without CHF findings; bibasilar subsegmental atelectatic changes    LABS REVIEWED:   04-22-14: wbc 7.8; hgb 12.9; hct 40.5; mcv 92 plt 39; glucose 96; bun 41; creat 2.14; k+3.9; na++139 mag 2.3 tsh 2.72; hgb a1c 7.7 04-23-14: glucose 115; bun 43; creat 2.16; k+4.2; na++ 132 04-28-14: wbc 7.0; hgb 13.3; hct 44.1 mcv 94.4; plt 177;  glucose 226; bun 43; creat 1.5; k+4.4; na++133; liver normal albumin 3.4  04-30-14: wbc 6.3; hgb 12.4; hct 40.8; mcv 91.1; plt 247; BNP 2300; na 132; bun 39; creat 1.6; gfr 46.96  Assessment/Plan 1. Follow up CHF exacerbation: Pt is improving; weight down Continue Lasix as scheduled Chest x-ray fairly normal; BNP elevated 2300 Continue to monitor  [place], Jacques Earthly, Student-NP   This encounter was created in error - please disregard.

## 2014-05-07 ENCOUNTER — Non-Acute Institutional Stay (SKILLED_NURSING_FACILITY): Payer: Medicare Other | Admitting: Internal Medicine

## 2014-05-07 DIAGNOSIS — G609 Hereditary and idiopathic neuropathy, unspecified: Secondary | ICD-10-CM

## 2014-05-07 DIAGNOSIS — E785 Hyperlipidemia, unspecified: Secondary | ICD-10-CM

## 2014-05-07 DIAGNOSIS — I509 Heart failure, unspecified: Secondary | ICD-10-CM

## 2014-05-07 DIAGNOSIS — K219 Gastro-esophageal reflux disease without esophagitis: Secondary | ICD-10-CM

## 2014-05-07 DIAGNOSIS — I4891 Unspecified atrial fibrillation: Secondary | ICD-10-CM

## 2014-05-07 DIAGNOSIS — R609 Edema, unspecified: Secondary | ICD-10-CM

## 2014-05-07 DIAGNOSIS — G629 Polyneuropathy, unspecified: Secondary | ICD-10-CM

## 2014-05-07 DIAGNOSIS — I5042 Chronic combined systolic (congestive) and diastolic (congestive) heart failure: Secondary | ICD-10-CM

## 2014-05-07 DIAGNOSIS — I8310 Varicose veins of unspecified lower extremity with inflammation: Secondary | ICD-10-CM

## 2014-05-07 DIAGNOSIS — R600 Localized edema: Secondary | ICD-10-CM

## 2014-05-07 DIAGNOSIS — E1159 Type 2 diabetes mellitus with other circulatory complications: Secondary | ICD-10-CM

## 2014-05-07 DIAGNOSIS — I872 Venous insufficiency (chronic) (peripheral): Secondary | ICD-10-CM

## 2014-05-07 DIAGNOSIS — I482 Chronic atrial fibrillation, unspecified: Secondary | ICD-10-CM

## 2014-05-07 DIAGNOSIS — K5900 Constipation, unspecified: Secondary | ICD-10-CM

## 2014-05-11 ENCOUNTER — Encounter (HOSPITAL_COMMUNITY): Payer: Self-pay | Admitting: Emergency Medicine

## 2014-05-11 ENCOUNTER — Non-Acute Institutional Stay (SKILLED_NURSING_FACILITY): Payer: Medicare Other | Admitting: Internal Medicine

## 2014-05-11 ENCOUNTER — Emergency Department (HOSPITAL_COMMUNITY): Payer: Medicare Other

## 2014-05-11 ENCOUNTER — Inpatient Hospital Stay (HOSPITAL_COMMUNITY)
Admission: EM | Admit: 2014-05-11 | Discharge: 2014-05-28 | DRG: 308 | Disposition: E | Payer: Medicare Other | Attending: Internal Medicine | Admitting: Internal Medicine

## 2014-05-11 DIAGNOSIS — G4733 Obstructive sleep apnea (adult) (pediatric): Secondary | ICD-10-CM | POA: Diagnosis present

## 2014-05-11 DIAGNOSIS — I4729 Other ventricular tachycardia: Principal | ICD-10-CM | POA: Diagnosis present

## 2014-05-11 DIAGNOSIS — E1129 Type 2 diabetes mellitus with other diabetic kidney complication: Secondary | ICD-10-CM | POA: Diagnosis present

## 2014-05-11 DIAGNOSIS — I129 Hypertensive chronic kidney disease with stage 1 through stage 4 chronic kidney disease, or unspecified chronic kidney disease: Secondary | ICD-10-CM | POA: Diagnosis present

## 2014-05-11 DIAGNOSIS — J984 Other disorders of lung: Secondary | ICD-10-CM | POA: Diagnosis present

## 2014-05-11 DIAGNOSIS — Z87891 Personal history of nicotine dependence: Secondary | ICD-10-CM

## 2014-05-11 DIAGNOSIS — J9600 Acute respiratory failure, unspecified whether with hypoxia or hypercapnia: Secondary | ICD-10-CM | POA: Diagnosis present

## 2014-05-11 DIAGNOSIS — I803 Phlebitis and thrombophlebitis of lower extremities, unspecified: Secondary | ICD-10-CM | POA: Diagnosis present

## 2014-05-11 DIAGNOSIS — Z833 Family history of diabetes mellitus: Secondary | ICD-10-CM | POA: Diagnosis not present

## 2014-05-11 DIAGNOSIS — N179 Acute kidney failure, unspecified: Secondary | ICD-10-CM | POA: Diagnosis present

## 2014-05-11 DIAGNOSIS — R609 Edema, unspecified: Secondary | ICD-10-CM | POA: Diagnosis present

## 2014-05-11 DIAGNOSIS — I4901 Ventricular fibrillation: Secondary | ICD-10-CM | POA: Diagnosis present

## 2014-05-11 DIAGNOSIS — E785 Hyperlipidemia, unspecified: Secondary | ICD-10-CM | POA: Diagnosis present

## 2014-05-11 DIAGNOSIS — N184 Chronic kidney disease, stage 4 (severe): Secondary | ICD-10-CM | POA: Diagnosis present

## 2014-05-11 DIAGNOSIS — R55 Syncope and collapse: Secondary | ICD-10-CM

## 2014-05-11 DIAGNOSIS — Z6841 Body Mass Index (BMI) 40.0 and over, adult: Secondary | ICD-10-CM | POA: Diagnosis not present

## 2014-05-11 DIAGNOSIS — Z794 Long term (current) use of insulin: Secondary | ICD-10-CM | POA: Diagnosis not present

## 2014-05-11 DIAGNOSIS — I872 Venous insufficiency (chronic) (peripheral): Secondary | ICD-10-CM | POA: Diagnosis present

## 2014-05-11 DIAGNOSIS — I2510 Atherosclerotic heart disease of native coronary artery without angina pectoris: Secondary | ICD-10-CM | POA: Diagnosis present

## 2014-05-11 DIAGNOSIS — I5043 Acute on chronic combined systolic (congestive) and diastolic (congestive) heart failure: Secondary | ICD-10-CM

## 2014-05-11 DIAGNOSIS — R404 Transient alteration of awareness: Secondary | ICD-10-CM | POA: Diagnosis present

## 2014-05-11 DIAGNOSIS — Z7901 Long term (current) use of anticoagulants: Secondary | ICD-10-CM

## 2014-05-11 DIAGNOSIS — I2589 Other forms of chronic ischemic heart disease: Secondary | ICD-10-CM | POA: Diagnosis present

## 2014-05-11 DIAGNOSIS — R570 Cardiogenic shock: Secondary | ICD-10-CM | POA: Diagnosis present

## 2014-05-11 DIAGNOSIS — E876 Hypokalemia: Secondary | ICD-10-CM | POA: Diagnosis present

## 2014-05-11 DIAGNOSIS — Z66 Do not resuscitate: Secondary | ICD-10-CM | POA: Diagnosis not present

## 2014-05-11 DIAGNOSIS — E873 Alkalosis: Secondary | ICD-10-CM | POA: Diagnosis present

## 2014-05-11 DIAGNOSIS — I472 Ventricular tachycardia, unspecified: Principal | ICD-10-CM | POA: Diagnosis present

## 2014-05-11 DIAGNOSIS — I5023 Acute on chronic systolic (congestive) heart failure: Secondary | ICD-10-CM | POA: Diagnosis present

## 2014-05-11 DIAGNOSIS — I482 Chronic atrial fibrillation, unspecified: Secondary | ICD-10-CM

## 2014-05-11 DIAGNOSIS — L03119 Cellulitis of unspecified part of limb: Secondary | ICD-10-CM

## 2014-05-11 DIAGNOSIS — I428 Other cardiomyopathies: Secondary | ICD-10-CM | POA: Diagnosis present

## 2014-05-11 DIAGNOSIS — R0600 Dyspnea, unspecified: Secondary | ICD-10-CM

## 2014-05-11 DIAGNOSIS — I509 Heart failure, unspecified: Secondary | ICD-10-CM | POA: Diagnosis present

## 2014-05-11 DIAGNOSIS — L02419 Cutaneous abscess of limb, unspecified: Secondary | ICD-10-CM | POA: Diagnosis present

## 2014-05-11 DIAGNOSIS — Z515 Encounter for palliative care: Secondary | ICD-10-CM

## 2014-05-11 DIAGNOSIS — I214 Non-ST elevation (NSTEMI) myocardial infarction: Secondary | ICD-10-CM

## 2014-05-11 DIAGNOSIS — I4891 Unspecified atrial fibrillation: Secondary | ICD-10-CM | POA: Diagnosis present

## 2014-05-11 DIAGNOSIS — E1149 Type 2 diabetes mellitus with other diabetic neurological complication: Secondary | ICD-10-CM | POA: Diagnosis present

## 2014-05-11 DIAGNOSIS — Z95810 Presence of automatic (implantable) cardiac defibrillator: Secondary | ICD-10-CM | POA: Diagnosis not present

## 2014-05-11 HISTORY — DX: Ventricular tachycardia, unspecified: I47.20

## 2014-05-11 HISTORY — DX: Ventricular tachycardia: I47.2

## 2014-05-11 LAB — CBC
HCT: 42.4 % (ref 39.0–52.0)
Hemoglobin: 13.4 g/dL (ref 13.0–17.0)
MCH: 28.2 pg (ref 26.0–34.0)
MCHC: 31.6 g/dL (ref 30.0–36.0)
MCV: 89.3 fL (ref 78.0–100.0)
Platelets: 221 10*3/uL (ref 150–400)
RBC: 4.75 MIL/uL (ref 4.22–5.81)
RDW: 18 % — ABNORMAL HIGH (ref 11.5–15.5)
WBC: 7.4 10*3/uL (ref 4.0–10.5)

## 2014-05-11 LAB — URINE MICROSCOPIC-ADD ON

## 2014-05-11 LAB — URINALYSIS, ROUTINE W REFLEX MICROSCOPIC
Bilirubin Urine: NEGATIVE
Glucose, UA: NEGATIVE mg/dL
Ketones, ur: NEGATIVE mg/dL
Leukocytes, UA: NEGATIVE
Nitrite: NEGATIVE
Protein, ur: NEGATIVE mg/dL
Specific Gravity, Urine: 1.008 (ref 1.005–1.030)
Urobilinogen, UA: 0.2 mg/dL (ref 0.0–1.0)
pH: 6.5 (ref 5.0–8.0)

## 2014-05-11 LAB — I-STAT CHEM 8, ED
BUN: 49 mg/dL — ABNORMAL HIGH (ref 6–23)
Calcium, Ion: 0.89 mmol/L — ABNORMAL LOW (ref 1.13–1.30)
Chloride: 81 mEq/L — ABNORMAL LOW (ref 96–112)
Creatinine, Ser: 1.6 mg/dL — ABNORMAL HIGH (ref 0.50–1.35)
GLUCOSE: 188 mg/dL — AB (ref 70–99)
HEMATOCRIT: 46 % (ref 39.0–52.0)
Hemoglobin: 15.6 g/dL (ref 13.0–17.0)
Potassium: 3.9 mEq/L (ref 3.7–5.3)
SODIUM: 131 meq/L — AB (ref 137–147)
TCO2: 47 mmol/L (ref 0–100)

## 2014-05-11 LAB — I-STAT TROPONIN, ED: TROPONIN I, POC: 0.13 ng/mL — AB (ref 0.00–0.08)

## 2014-05-11 LAB — TROPONIN I: TROPONIN I: 0.48 ng/mL — AB (ref <0.30)

## 2014-05-11 LAB — HEPATIC FUNCTION PANEL
ALK PHOS: 135 U/L — AB (ref 39–117)
ALT: 23 U/L (ref 0–53)
AST: 34 U/L (ref 0–37)
Albumin: 3 g/dL — ABNORMAL LOW (ref 3.5–5.2)
BILIRUBIN INDIRECT: 0.8 mg/dL (ref 0.3–0.9)
BILIRUBIN TOTAL: 1.5 mg/dL — AB (ref 0.3–1.2)
Bilirubin, Direct: 0.7 mg/dL — ABNORMAL HIGH (ref 0.0–0.3)
TOTAL PROTEIN: 6.4 g/dL (ref 6.0–8.3)

## 2014-05-11 LAB — PRO B NATRIURETIC PEPTIDE: Pro B Natriuretic peptide (BNP): 5088 pg/mL — ABNORMAL HIGH (ref 0–125)

## 2014-05-11 LAB — MAGNESIUM: MAGNESIUM: 1.7 mg/dL (ref 1.5–2.5)

## 2014-05-11 MED ORDER — FUROSEMIDE 10 MG/ML IJ SOLN
40.0000 mg | Freq: Once | INTRAMUSCULAR | Status: AC
  Administered 2014-05-11: 40 mg via INTRAVENOUS
  Filled 2014-05-11: qty 4

## 2014-05-11 MED ORDER — ASPIRIN EC 325 MG PO TBEC
325.0000 mg | DELAYED_RELEASE_TABLET | Freq: Once | ORAL | Status: AC
  Administered 2014-05-11: 325 mg via ORAL
  Filled 2014-05-11: qty 1

## 2014-05-11 MED ORDER — MAGNESIUM SULFATE 40 MG/ML IJ SOLN
2.0000 g | Freq: Once | INTRAMUSCULAR | Status: AC
  Administered 2014-05-11: 2 g via INTRAVENOUS
  Filled 2014-05-11: qty 50

## 2014-05-11 NOTE — ED Notes (Signed)
Per EMS pt from [facility] to be evaluated for possible syncope/seizure. Pt's sister saw pt close his eyes with shoulder jerking. Sister called pt's name a few times before pt was responding. Pt did not appear post-ictal, no injury or incontinence noted. Pt neuro intact. Pt has pacemaker/defib in left upper chest.

## 2014-05-11 NOTE — ED Notes (Signed)
Hyacinth Meeker, MD aware of abnormal lab test results

## 2014-05-11 NOTE — Progress Notes (Signed)
Patient ID: Justin Davis, male   DOB: [birth year], 68 y.o.   MRN: [id number]    Facility: [facility]   Chief Complaint  Patient presents with  . Acute Visit    syncopal episodes   Allergies  Allergen Reactions  . Ciprofloxacin Other (See Comments)    RESPIRATORY DISTRESS  . Clindamycin/Lincomycin Swelling and Other (See Comments)    CHEST PAIN  . Carvedilol Diarrhea  . Spironolactone Other (See Comments)    Stopped pores in breast   HPI 68 y/o male patient here for STR is seen today for acute concerns. He was attending the care plan meeting when he was noted by family and staff to have a witnessed syncope. He returned back to his baseline in few seconds. He was then brought to the room and again had another episode of "passing out". He is seen in his room at his bedside. He is currently alert and oriented, reviewed his vital signs, he has slow heart rate on review. He mentions this has happened 4-5 times today. He mentions that he has not been shocked by the ICD. He denies chest pain or dyspnea but feels weak. Denies any prodromal symptoms prior to the syncope. Denies any headache at present. Staff denies any jerking or involuntary movement or weakness of any extremities during the episode.patient's sister is present at bedside.  ROS Denies chest pain or palpitations Denies dyspnea, wheezing or cough Denies straining activities prior to passing out episodes Denies focal weakness or headache. Feels weak and lightheaded Denies abdominal pain, nausea or vomiting Denies diarrhea  He has history of CAD and ischemic cardiomyopathy, Afib and has ICD.   Past Medical History  Diagnosis Date  . Diabetes   . Arthritis   . HBP (high blood pressure)   . CHF (congestive heart failure)   . CAD (coronary artery disease)   . Chronic kidney disease   . Hyperlipidemia   . A-fib   . Morbid obesity   . Obstructive sleep apnea   . Chronic venous stasis dermatitis of  both lower extremities   . Thrombocytopenia   . Left nephrolithiasis   . Ventricular tachycardia    Medication reviewed. See [facility]  Physical exam BP 118/78  Pulse 57  Temp(Src) 97.2 F (36.2 C)  Resp 16  SpO2 97%  General- elderly male in no acute distress, obese Head- atraumatic, normocephalic Neck- no lymphadenopathy Cardiovascular- irregular heart rate, no murmurs Respiratory- bilateral clear to auscultation, no wheeze, no rhonchi, no crackles Abdomen- bowel sounds present, soft, non tender Musculoskeletal- able to move all 4 extremities, weakness in lower extremities, leg edema present, good dorsalis pedis and radial pulse.   Neurological- no focal deficit Psychiatry- alert and oriented   Assessment/plan  Syncope With around 5 syncopal episodes and his complicated cardiac history, I will send him to ED to interrogate his ICD to help assess his cardiac rhythm and treat accordingly. He also has ICM with poor EF and could be having chf exacerbation. Unlikely to be seizure episodes at present. Copy of his facility med list will be sent with him. Explained and pt would like to go to the [place] but keeping time of transportation in mind, explained about sending him to [place] and patient agrees.  Spent more than 30 minutes organizing care for patient

## 2014-05-11 NOTE — ED Provider Notes (Signed)
CSN: [id number]     Arrival date & time 05/05/2014  1724 History   First MD Initiated Contact with Patient 05/14/2014 1744     Chief Complaint  Patient presents with  . Loss of Consciousness     (Consider location/radiation/quality/duration/timing/severity/associated sxs/prior Treatment) HPI  Pt with hx of vtach, nonischemic cardiomyopathy/ CHF, CHB s/p Medtronic BiV defibrillator, OSA, HTN, DM II who presents for eval of syncope.  Pt states he passed out 5 times since 1500. No presyncopal sx.  Denies CP, new dyspnea, pleurisy, hemoptysis, headache, vision changes, fevers, chills. He says he has never passed out before.  Episodes lasted a few seconds and were witnessed by daughter.   History of ventricular tachycardia. 2. Nonischemic cardiomyopathy, most recent echocardiogram with ejection fraction of 55%. 2. Complete heart-block. 3. Status post Medtronic biventricular implantable cardioverter defibrillator in 2005 at the [facility]. 4. Morbid obesity. 5. Obstructive sleep apnea on CPAP. 6. Restrictive lung disease. 7. Hypertension. 8. Hyperlipidemia. 9. Type 2 diabetes.   Past Medical History  Diagnosis Date  . Diabetes   . Swelling     FEET AND LEGS  . Arthritis   . HBP (high blood pressure)   . Bruises easily   . CHF (congestive heart failure)   . CAD (coronary artery disease)   . Chronic kidney disease   . Hyperlipidemia   . A-fib   . Morbid obesity   . Obstructive sleep apnea   . Chronic venous stasis dermatitis of both lower extremities   . Thrombocytopenia   . Left nephrolithiasis    Past Surgical History  Procedure Laterality Date  . Cardiac surgery    . Knee surgery Bilateral   . Cardiac defibrillator placement     No family history on file. History  Substance Use Topics  . Smoking status: Former Smoker    Types: Cigarettes    Quit date: 06/03/1983  . Smokeless tobacco: Never Used  . Alcohol Use: No     Comment: STOPPED DRINKING ALCOHOL 2004    Review  of Systems    Allergies  Carvedilol; Ciprofloxacin; Clindamycin/lincomycin; and Spironolactone  Home Medications   Prior to Admission medications   Medication Sig Start Date End Date Taking? Authorizing Provider  albuterol (PROVENTIL HFA;VENTOLIN HFA) 108 (90 BASE) MCG/ACT inhaler Inhale 2 puffs into the lungs 2 (two) times daily.     Historical Provider, MD  amiodarone (PACERONE) 200 MG tablet Take 200 mg by mouth daily.    Historical Provider, MD  aspirin 81 MG tablet Take 81 mg by mouth daily.    Historical Provider, MD  docusate sodium (COLACE) 100 MG capsule Take 100 mg by mouth 2 (two) times daily as needed for mild constipation.    Historical Provider, MD  flunisolide (NASAREL) 29 MCG/ACT (0.025%) nasal spray Place 2 sprays into the nose 2 (two) times daily. Dose is for each nostril.    Historical Provider, MD  furosemide (LASIX) 40 MG tablet Take 1 tablet (40 mg total) by mouth daily. 05/04/14   Sharee Holster, NP  gabapentin (NEURONTIN) 400 MG capsule Take 600 mg by mouth 3 (three) times daily.     Historical Provider, MD  glipiZIDE (GLUCOTROL XL) 2.5 MG 24 hr tablet Take 2.5 mg by mouth daily with breakfast.    Historical Provider, MD  insulin aspart (NOVOLOG) 100 UNIT/ML injection Inject 5 Units into the skin 3 (three) times daily before meals. For cbg >=150 04/27/14   Sharee Holster, NP  insulin glargine (  LANTUS) 100 UNIT/ML injection Inject 0.1 mLs (10 Units total) into the skin at bedtime. 04/27/14   Sharee Holster, NP  loratadine (CLARITIN) 10 MG tablet Take 10 mg by mouth daily.    Historical Provider, MD  nitroGLYCERIN (NITROSTAT) 0.4 MG SL tablet Place 0.4 mg under the tongue every 5 (five) minutes as needed for chest pain.    Historical Provider, MD  omeprazole (PRILOSEC) 20 MG capsule Take 20 mg by mouth daily.    Historical Provider, MD  oxyCODONE (OXY IR/ROXICODONE) 5 MG immediate release tablet Take 1 tablet (5 mg total) by mouth every 6 (six) hours as needed for severe  pain. 05/04/14   Sharee Holster, NP  rosuvastatin (CRESTOR) 10 MG tablet Take 10 mg by mouth daily.    Historical Provider, MD  warfarin (COUMADIN) 5 MG tablet Take 3 mg by mouth daily at 6 PM.     Historical Provider, MD   BP 100/72  Temp(Src) 97.6 F (36.4 C) (Oral)  Resp 19  Ht  (1.702 m)  Wt 295 lb (133.811 kg)  BMI 46.19 kg/m2  SpO2 96% Physical Exam  Nursing note and vitals reviewed. Constitutional: He is oriented to person, place, and time. He appears well-developed and well-nourished.  Chronically ill appearing  HENT:  Head: Normocephalic and atraumatic.  Nose: Nose normal.  Mouth/Throat: No oropharyngeal exudate.  Eyes: Conjunctivae are normal.  Neck: Normal range of motion. Neck supple. JVD: unable to appreciate due to body habitus. No tracheal deviation present.  Cardiovascular: Normal rate, normal heart sounds and intact distal pulses.   No murmur heard. irregular  Pulmonary/Chest: Effort normal and breath sounds normal. No respiratory distress. He has no wheezes. He has no rales. He exhibits no tenderness.  Abdominal: Soft. Bowel sounds are normal. He exhibits no distension and no mass. There is no tenderness.  Musculoskeletal: Normal range of motion. He exhibits edema (3+ bilateral weeping, warmth) and tenderness.  Neurological: He is alert and oriented to person, place, and time.  Normal strength and sensation. CN 3-12 tested and intact. No drift. Normal FTN  Skin: Skin is warm and dry. No rash noted.  Psychiatric: He has a normal mood and affect.    ED Course  Procedures (including critical care time) Labs Review Labs Reviewed  CBC - Abnormal; Notable for the following:    RDW 18.0 (*)    All other components within normal limits  PRO B NATRIURETIC PEPTIDE - Abnormal; Notable for the following:    Pro B Natriuretic peptide (BNP) 5088.0 (*)    All other components within normal limits  URINALYSIS, ROUTINE W REFLEX MICROSCOPIC - Abnormal; Notable for the  following:    Hgb urine dipstick MODERATE (*)    All other components within normal limits  HEPATIC FUNCTION PANEL - Abnormal; Notable for the following:    Albumin 3.0 (*)    Alkaline Phosphatase 135 (*)    Total Bilirubin 1.5 (*)    Bilirubin, Direct 0.7 (*)    All other components within normal limits  TROPONIN I - Abnormal; Notable for the following:    Troponin I 0.48 (*)    All other components within normal limits  I-STAT CHEM 8, ED - Abnormal; Notable for the following:    Sodium 131 (*)    Chloride 81 (*)    BUN 49 (*)    Creatinine, Ser 1.60 (*)    Glucose, Bld 188 (*)    Calcium, Ion 0.89 (*)  All other components within normal limits  I-STAT TROPOININ, ED - Abnormal; Notable for the following:    Troponin i, poc 0.13 (*)    All other components within normal limits  URINE MICROSCOPIC-ADD ON  MAGNESIUM    Imaging Review Dg Chest Portable 1 View  05-31-14   CLINICAL DATA:  Multiple syncopal episodes.  EXAM: PORTABLE CHEST - 1 VIEW  COMPARISON:  Chest radiograph 04/21/2014  FINDINGS: Left-sided pacemaker overlies stable enlarged cardiac silhouette. Low lung volumes similar prior. Central venous congestion is present.  IMPRESSION: Mild increase in central pulmonary venous congestion. Cardiomegaly and low lung volumes similar prior.   Electronically Signed   By: Genevive Bi M.D.   On: 31-May-2014 18:18     EKG Interpretation   Date/Time:  Monday 05/31/2014 17:35:56 EDT Ventricular Rate:  122 PR Interval:    QRS Duration: 219 QT Interval:  411 QTC Calculation: 586 R Axis:   -132 Text Interpretation:  Atrial fibrillation Ventricular tachycardia,  unsustained Right bundle branch block ST depr, consider ischemia,  anterolateral lds Baseline wander in lead(s) V2 V3 Abnormal ekg No old  tracing to compare Confirmed by MILLER  MD, BRIAN ([id number]) on 05/31/2014  6:13:37 PM      MDM   Final diagnoses:  Ventricular tachycardia  Syncope, unspecified  syncope type  NSTEMI (non-ST elevated myocardial infarction)    Pt with complicated cardiac history presents for syncope at rest without prodromal symptoms.  Mentating well, AAOx4. Hx not cw seizures. Chronic cellulitis/phlebitis on exam. EKG with polymorphic QRS complexes, some wide, possibly a fib with intraventricular block. No EKG to compare to.  CKD, considered hyperK but wnl.  Pt denies chest pain. CHF hx with pulm edema gave Lasix (not always compliant at home).  Interrogated pacemaker which demonstrated that the patient was defibrilated 7x today after 1600 with fast unorganized rhythms at 230-240 bpm at 35J.  Pt does not remember feeling the shocks. Troponin elevated.  Consulted cardiology, will admit.   Pt remains asymptomatic during ED course. Pulse ranged from 50-100. BP stable.     Sofie Rower, MD May 31, 2014 [phone]

## 2014-05-11 NOTE — ED Provider Notes (Signed)
I saw and evaluated the patient, reviewed the resident's note and I agree with the findings and plan.  Please see my separate note regarding my evaluation of the patient.  Clinical Impression:    Final diagnoses:  Ventricular tachycardia  Syncope, unspecified syncope type  NSTEMI (non-ST elevated myocardial infarction)     Vida Roller, MD 05/06/2014 2348

## 2014-05-11 NOTE — ED Provider Notes (Signed)
68 year old male with a history of heart disease, treated at the [facility] with pacemaker in the past, believes his battery was last changed 3 years ago. He states that he had 5 syncopal episodes in the last several hours that had no prodromal symptoms, he has no chest pain, no shortness of breath at this time, some abnormal feelings in his chest and arm prior to this. He has bilateral leg swelling with "phlebitis". On exam the patient is clear heart and lung sounds, mildly tachypneic, no murmurs auscultated though he has a very irregular rhythm and his EKG is very abnormal as well with no old EKG with which to compare. He will likely need evaluation in an inpatient setting, labs and x-ray pending at this time.  Multiple re-evaluations, the patient had no further syncopal episodes here. Lab workup showed no other significant findings to suggest a source of the patient's ongoing frequent ventricular tachycardia. Pacer interrogated and showed multiple episodes of wide-complex tachycardia resulting in the fibrillation. Discussed with cardiology who will admit.  I saw and evaluated the patient, reviewed the resident's note and I agree with the findings and plan.   EKG Interpretation  Date/Time:  Monday May 11 2014 17:35:56 EDT Ventricular Rate:  122 PR Interval:    QRS Duration: 219 QT Interval:  411 QTC Calculation: 586 R Axis:   -132 Text Interpretation:  Atrial fibrillation Ventricular tachycardia, unsustained Right bundle branch block ST depr, consider ischemia, anterolateral lds Baseline wander in lead(s) V2 V3 Abnormal ekg No old tracing to compare Confirmed by Evalee Gerard  MD, Karina Lenderman ([id number]) on 05/20/2014 6:13:37 PM      CRITICAL CARE Performed by: Eber Hong D Total critical care time: 35 Critical care time was exclusive of separately billable procedures and treating other patients. Critical care was necessary to treat or prevent imminent or life-threatening  deterioration. Critical care was time spent personally by me on the following activities: development of treatment plan with patient and/or surrogate as well as nursing, discussions with consultants, evaluation of patient's response to treatment, examination of patient, obtaining history from patient or surrogate, ordering and performing treatments and interventions, ordering and review of laboratory studies, ordering and review of radiographic studies, pulse oximetry and re-evaluation of patient's condition.   Clinical Impression:    Final diagnoses:  Ventricular tachycardia  Syncope, unspecified syncope type  NSTEMI (non-ST elevated myocardial infarction)       Vida Roller, MD 05/12/2014 2350

## 2014-05-11 NOTE — ED Notes (Signed)
medtronic pacemaker

## 2014-05-12 ENCOUNTER — Encounter (HOSPITAL_COMMUNITY): Payer: Self-pay | Admitting: *Deleted

## 2014-05-12 ENCOUNTER — Inpatient Hospital Stay (HOSPITAL_COMMUNITY): Payer: Medicare Other

## 2014-05-12 DIAGNOSIS — I2589 Other forms of chronic ischemic heart disease: Secondary | ICD-10-CM

## 2014-05-12 DIAGNOSIS — I2510 Atherosclerotic heart disease of native coronary artery without angina pectoris: Secondary | ICD-10-CM

## 2014-05-12 DIAGNOSIS — I5023 Acute on chronic systolic (congestive) heart failure: Secondary | ICD-10-CM

## 2014-05-12 DIAGNOSIS — I4901 Ventricular fibrillation: Secondary | ICD-10-CM

## 2014-05-12 DIAGNOSIS — I509 Heart failure, unspecified: Secondary | ICD-10-CM

## 2014-05-12 DIAGNOSIS — I4891 Unspecified atrial fibrillation: Secondary | ICD-10-CM

## 2014-05-12 DIAGNOSIS — R570 Cardiogenic shock: Secondary | ICD-10-CM

## 2014-05-12 DIAGNOSIS — I472 Ventricular tachycardia, unspecified: Secondary | ICD-10-CM | POA: Diagnosis present

## 2014-05-12 DIAGNOSIS — I8310 Varicose veins of unspecified lower extremity with inflammation: Secondary | ICD-10-CM

## 2014-05-12 DIAGNOSIS — I369 Nonrheumatic tricuspid valve disorder, unspecified: Secondary | ICD-10-CM

## 2014-05-12 DIAGNOSIS — R55 Syncope and collapse: Secondary | ICD-10-CM

## 2014-05-12 DIAGNOSIS — I4729 Other ventricular tachycardia: Principal | ICD-10-CM

## 2014-05-12 LAB — CARBOXYHEMOGLOBIN
CARBOXYHEMOGLOBIN: 0.6 % (ref 0.5–1.5)
Carboxyhemoglobin: 1 % (ref 0.5–1.5)
Carboxyhemoglobin: 1.3 % (ref 0.5–1.5)
METHEMOGLOBIN: 0.7 % (ref 0.0–1.5)
Methemoglobin: 0.7 % (ref 0.0–1.5)
Methemoglobin: 0.4 % (ref 0.0–1.5)
O2 SAT: 39.1 %
O2 Saturation: 26.9 %
O2 Saturation: 52.2 %
Total hemoglobin: 14.3 g/dL (ref 13.5–18.0)
Total hemoglobin: 14.7 g/dL (ref 13.5–18.0)
Total hemoglobin: 14.1 g/dL (ref 13.5–18.0)

## 2014-05-12 LAB — GLUCOSE, CAPILLARY
Glucose-Capillary: 188 mg/dL — ABNORMAL HIGH (ref 70–99)
Glucose-Capillary: 195 mg/dL — ABNORMAL HIGH (ref 70–99)
Glucose-Capillary: 209 mg/dL — ABNORMAL HIGH (ref 70–99)
Glucose-Capillary: 121 mg/dL — ABNORMAL HIGH (ref 70–99)
Glucose-Capillary: 149 mg/dL — ABNORMAL HIGH (ref 70–99)

## 2014-05-12 LAB — TROPONIN I
Troponin I: <0.3 ng/mL (ref <0.30)
Troponin I: <0.3 ng/mL (ref <0.30)
Troponin I: <0.3 ng/mL (ref <0.30)

## 2014-05-12 LAB — BASIC METABOLIC PANEL
ANION GAP: 15 (ref 5–15)
Anion gap: 13 (ref 5–15)
Anion gap: 13 (ref 5–15)
BUN: 32 mg/dL — ABNORMAL HIGH (ref 6–23)
BUN: 34 mg/dL — ABNORMAL HIGH (ref 6–23)
BUN: 34 mg/dL — AB (ref 6–23)
CHLORIDE: 84 meq/L — AB (ref 96–112)
CHLORIDE: 83 meq/L — AB (ref 96–112)
CO2: 42 meq/L — AB (ref 19–32)
CO2: 42 meq/L — AB (ref 19–32)
CO2: 38 mEq/L — ABNORMAL HIGH (ref 19–32)
Calcium: 9 mg/dL (ref 8.4–10.5)
Calcium: 8.8 mg/dL (ref 8.4–10.5)
Calcium: 8.9 mg/dL (ref 8.4–10.5)
Chloride: 84 mEq/L — ABNORMAL LOW (ref 96–112)
Creatinine, Ser: 1.38 mg/dL — ABNORMAL HIGH (ref 0.50–1.35)
Creatinine, Ser: 1.47 mg/dL — ABNORMAL HIGH (ref 0.50–1.35)
Creatinine, Ser: 1.83 mg/dL — ABNORMAL HIGH (ref 0.50–1.35)
GFR calc Af Amer: 54 mL/min — ABNORMAL LOW (ref >90)
GFR calc non Af Amer: 50 mL/min — ABNORMAL LOW (ref >90)
GFR calc non Af Amer: 47 mL/min — ABNORMAL LOW (ref >90)
GFR, EST AFRICAN AMERICAN: 58 mL/min — AB (ref >90)
GFR, EST AFRICAN AMERICAN: 41 mL/min — AB (ref >90)
GFR, EST NON AFRICAN AMERICAN: 36 mL/min — AB (ref >90)
Glucose, Bld: 180 mg/dL — ABNORMAL HIGH (ref 70–99)
Glucose, Bld: 177 mg/dL — ABNORMAL HIGH (ref 70–99)
Glucose, Bld: 136 mg/dL — ABNORMAL HIGH (ref 70–99)
POTASSIUM: 2.2 meq/L — AB (ref 3.7–5.3)
Potassium: 2.4 mEq/L — CL (ref 3.7–5.3)
Potassium: 3.6 mEq/L — ABNORMAL LOW (ref 3.7–5.3)
Sodium: 139 mEq/L (ref 137–147)
Sodium: 139 mEq/L (ref 137–147)
Sodium: 136 mEq/L — ABNORMAL LOW (ref 137–147)

## 2014-05-12 LAB — MRSA PCR SCREENING: MRSA by PCR: NEGATIVE

## 2014-05-12 LAB — MAGNESIUM
Magnesium: 2.1 mg/dL (ref 1.5–2.5)
Magnesium: 2.1 mg/dL (ref 1.5–2.5)

## 2014-05-12 LAB — PROTIME-INR
INR: 1.62 — AB (ref 0.00–1.49)
Prothrombin Time: 19.2 seconds — ABNORMAL HIGH (ref 11.6–15.2)

## 2014-05-12 LAB — HEPARIN LEVEL (UNFRACTIONATED): Heparin Unfractionated: 0.25 IU/mL — ABNORMAL LOW (ref 0.30–0.70)

## 2014-05-12 MED ORDER — WARFARIN - PHARMACIST DOSING INPATIENT
Freq: Every day | Status: DC
  Administered 2014-05-13: 18:00:00

## 2014-05-12 MED ORDER — DEXTROSE 5 % IV SOLN
2.0000 ug/min | INTRAVENOUS | Status: DC
  Administered 2014-05-12 – 2014-05-13 (×2): 15 ug/min via INTRAVENOUS
  Administered 2014-05-14: 14 ug/min via INTRAVENOUS
  Filled 2014-05-12 (×4): qty 16

## 2014-05-12 MED ORDER — FUROSEMIDE 10 MG/ML IJ SOLN
80.0000 mg | Freq: Once | INTRAMUSCULAR | Status: AC
  Administered 2014-05-12: 80 mg via INTRAVENOUS
  Filled 2014-05-12: qty 8

## 2014-05-12 MED ORDER — AMIODARONE LOAD VIA INFUSION
150.0000 mg | Freq: Once | INTRAVENOUS | Status: AC
  Administered 2014-05-12: 150 mg via INTRAVENOUS
  Filled 2014-05-12: qty 83.34

## 2014-05-12 MED ORDER — INSULIN ASPART 100 UNIT/ML ~~LOC~~ SOLN
5.0000 [IU] | Freq: Three times a day (TID) | SUBCUTANEOUS | Status: DC
  Administered 2014-05-12 – 2014-05-14 (×6): 5 [IU] via SUBCUTANEOUS

## 2014-05-12 MED ORDER — ONDANSETRON HCL 4 MG/2ML IJ SOLN
4.0000 mg | Freq: Four times a day (QID) | INTRAMUSCULAR | Status: DC | PRN
  Administered 2014-05-12 – 2014-05-13 (×2): 4 mg via INTRAVENOUS
  Filled 2014-05-12 (×2): qty 2

## 2014-05-12 MED ORDER — INSULIN GLARGINE 100 UNIT/ML ~~LOC~~ SOLN
10.0000 [IU] | Freq: Every day | SUBCUTANEOUS | Status: DC
  Administered 2014-05-12 – 2014-05-13 (×2): 10 [IU] via SUBCUTANEOUS
  Filled 2014-05-12 (×4): qty 0.1

## 2014-05-12 MED ORDER — WARFARIN SODIUM 10 MG PO TABS
10.0000 mg | ORAL_TABLET | Freq: Once | ORAL | Status: AC
  Administered 2014-05-12: 10 mg via ORAL
  Filled 2014-05-12: qty 1

## 2014-05-12 MED ORDER — POTASSIUM CHLORIDE 10 MEQ/100ML IV SOLN
10.0000 meq | INTRAVENOUS | Status: AC
  Administered 2014-05-12 (×4): 10 meq via INTRAVENOUS
  Filled 2014-05-12 (×4): qty 100

## 2014-05-12 MED ORDER — SODIUM CHLORIDE 0.9 % IV BOLUS (SEPSIS)
250.0000 mL | Freq: Once | INTRAVENOUS | Status: AC
  Administered 2014-05-12: 250 mL via INTRAVENOUS

## 2014-05-12 MED ORDER — HEPARIN BOLUS VIA INFUSION
2000.0000 [IU] | Freq: Once | INTRAVENOUS | Status: AC
  Administered 2014-05-12: 2000 [IU] via INTRAVENOUS
  Filled 2014-05-12: qty 2000

## 2014-05-12 MED ORDER — DOCUSATE SODIUM 100 MG PO CAPS: 100.0000 mg | ORAL_CAPSULE | Freq: Two times a day (BID) | ORAL | Status: DC

## 2014-05-12 MED ORDER — GABAPENTIN 600 MG PO TABS
600.0000 mg | ORAL_TABLET | Freq: Three times a day (TID) | ORAL | Status: DC
  Administered 2014-05-12 – 2014-05-14 (×8): 600 mg via ORAL
  Filled 2014-05-12 (×10): qty 1

## 2014-05-12 MED ORDER — METOPROLOL TARTRATE 1 MG/ML IV SOLN
INTRAVENOUS | Status: AC
  Filled 2014-05-12: qty 5

## 2014-05-12 MED ORDER — METOPROLOL TARTRATE 1 MG/ML IV SOLN
2.5000 mg | Freq: Once | INTRAVENOUS | Status: AC
  Administered 2014-05-12: 2.5 mg via INTRAVENOUS

## 2014-05-12 MED ORDER — SPIRONOLACTONE 25 MG PO TABS
25.0000 mg | ORAL_TABLET | Freq: Every day | ORAL | Status: DC
  Filled 2014-05-12 (×2): qty 1

## 2014-05-12 MED ORDER — SODIUM CHLORIDE 0.9 % IV SOLN
250.0000 mL | INTRAVENOUS | Status: DC | PRN
  Administered 2014-05-12: 1000 mL via INTRAVENOUS

## 2014-05-12 MED ORDER — POTASSIUM CHLORIDE CRYS ER 20 MEQ PO TBCR
40.0000 meq | EXTENDED_RELEASE_TABLET | Freq: Once | ORAL | Status: AC
  Administered 2014-05-12: 40 meq via ORAL

## 2014-05-12 MED ORDER — AMIODARONE HCL IN DEXTROSE 360-4.14 MG/200ML-% IV SOLN
60.0000 mg/h | INTRAVENOUS | Status: AC
  Administered 2014-05-12 (×3): 60 mg/h via INTRAVENOUS
  Filled 2014-05-12: qty 200

## 2014-05-12 MED ORDER — INSULIN ASPART 100 UNIT/ML ~~LOC~~ SOLN
0.0000 [IU] | Freq: Three times a day (TID) | SUBCUTANEOUS | Status: DC
  Administered 2014-05-12: 3 [IU] via SUBCUTANEOUS
  Administered 2014-05-12 – 2014-05-13 (×3): 5 [IU] via SUBCUTANEOUS
  Administered 2014-05-13: 3 [IU] via SUBCUTANEOUS
  Administered 2014-05-14: 5 [IU] via SUBCUTANEOUS

## 2014-05-12 MED ORDER — CETYLPYRIDINIUM CHLORIDE 0.05 % MT LIQD
7.0000 mL | Freq: Two times a day (BID) | OROMUCOSAL | Status: DC
  Administered 2014-05-12 – 2014-05-14 (×5): 7 mL via OROMUCOSAL

## 2014-05-12 MED ORDER — PRO-STAT SUGAR FREE PO LIQD
30.0000 mL | Freq: Two times a day (BID) | ORAL | Status: DC
  Administered 2014-05-13 – 2014-05-14 (×2): 30 mL via ORAL
  Filled 2014-05-12 (×6): qty 30

## 2014-05-12 MED ORDER — OXYCODONE HCL 5 MG PO TABS
5.0000 mg | ORAL_TABLET | Freq: Four times a day (QID) | ORAL | Status: DC | PRN
Start: 2014-05-12 — End: 2014-05-14
  Administered 2014-05-12 (×2): 5 mg via ORAL
  Filled 2014-05-12 (×12): qty 1

## 2014-05-12 MED ORDER — FUROSEMIDE 10 MG/ML IJ SOLN
80.0000 mg | Freq: Two times a day (BID) | INTRAMUSCULAR | Status: DC
  Administered 2014-05-13 – 2014-05-14 (×3): 80 mg via INTRAVENOUS
  Filled 2014-05-12 (×6): qty 8

## 2014-05-12 MED ORDER — FUROSEMIDE 10 MG/ML IJ SOLN
80.0000 mg | Freq: Once | INTRAMUSCULAR | Status: AC
  Administered 2014-05-12: 80 mg via INTRAVENOUS

## 2014-05-12 MED ORDER — SODIUM CHLORIDE 0.9 % IJ SOLN
3.0000 mL | Freq: Two times a day (BID) | INTRAMUSCULAR | Status: DC
  Administered 2014-05-12 – 2014-05-13 (×5): 3 mL via INTRAVENOUS

## 2014-05-12 MED ORDER — POTASSIUM CHLORIDE CRYS ER 20 MEQ PO TBCR
20.0000 meq | EXTENDED_RELEASE_TABLET | Freq: Once | ORAL | Status: AC
  Administered 2014-05-12: 20 meq via ORAL
  Filled 2014-05-12: qty 1

## 2014-05-12 MED ORDER — POTASSIUM CHLORIDE 10 MEQ/50ML IV SOLN
10.0000 meq | INTRAVENOUS | Status: AC
  Administered 2014-05-12 (×2): 10 meq via INTRAVENOUS
  Filled 2014-05-12: qty 50

## 2014-05-12 MED ORDER — HEPARIN (PORCINE) IN NACL 100-0.45 UNIT/ML-% IJ SOLN
1450.0000 [IU]/h | INTRAMUSCULAR | Status: DC
  Administered 2014-05-12: 1250 [IU]/h via INTRAVENOUS
  Administered 2014-05-13 (×2): 1450 [IU]/h via INTRAVENOUS
  Filled 2014-05-12 (×5): qty 250

## 2014-05-12 MED ORDER — MILRINONE IN DEXTROSE 20 MG/100ML IV SOLN
0.1250 ug/kg/min | INTRAVENOUS | Status: DC
  Administered 2014-05-12 – 2014-05-14 (×3): 0.125 ug/kg/min via INTRAVENOUS
  Filled 2014-05-12 (×3): qty 100

## 2014-05-12 MED ORDER — AMIODARONE HCL IN DEXTROSE 360-4.14 MG/200ML-% IV SOLN
30.0000 mg/h | INTRAVENOUS | Status: DC
  Administered 2014-05-12 – 2014-05-14 (×6): 30 mg/h via INTRAVENOUS
  Filled 2014-05-12 (×15): qty 200

## 2014-05-12 MED ORDER — PANTOPRAZOLE SODIUM 40 MG PO TBEC
40.0000 mg | DELAYED_RELEASE_TABLET | Freq: Every day | ORAL | Status: DC
  Administered 2014-05-12 – 2014-05-14 (×3): 40 mg via ORAL
  Filled 2014-05-12 (×3): qty 1

## 2014-05-12 MED ORDER — AMIODARONE HCL IN DEXTROSE 360-4.14 MG/200ML-% IV SOLN
INTRAVENOUS | Status: AC
  Filled 2014-05-12: qty 200

## 2014-05-12 MED ORDER — ASPIRIN 81 MG PO TABS: 81.0000 mg | ORAL_TABLET | Freq: Every day | ORAL | Status: DC

## 2014-05-12 MED ORDER — AMIODARONE HCL 200 MG PO TABS
200.0000 mg | ORAL_TABLET | Freq: Every day | ORAL | Status: DC
  Filled 2014-05-12: qty 1

## 2014-05-12 MED ORDER — METOPROLOL TARTRATE 12.5 MG HALF TABLET
12.5000 mg | ORAL_TABLET | Freq: Two times a day (BID) | ORAL | Status: DC
  Administered 2014-05-12: 12.5 mg via ORAL
  Filled 2014-05-12 (×3): qty 1

## 2014-05-12 MED ORDER — SODIUM CHLORIDE 0.9 % IJ SOLN: 3.0000 mL | INTRAMUSCULAR | Status: DC | PRN

## 2014-05-12 MED ORDER — POTASSIUM CHLORIDE CRYS ER 20 MEQ PO TBCR
40.0000 meq | EXTENDED_RELEASE_TABLET | Freq: Once | ORAL | Status: AC
  Administered 2014-05-12: 40 meq via ORAL
  Filled 2014-05-12: qty 2

## 2014-05-12 MED ORDER — ATORVASTATIN CALCIUM 40 MG PO TABS
40.0000 mg | ORAL_TABLET | Freq: Every day | ORAL | Status: DC
  Administered 2014-05-12 – 2014-05-13 (×2): 40 mg via ORAL
  Filled 2014-05-12 (×3): qty 1

## 2014-05-12 MED ORDER — DOCUSATE SODIUM 100 MG PO CAPS
100.0000 mg | ORAL_CAPSULE | Freq: Two times a day (BID) | ORAL | Status: DC | PRN
  Administered 2014-05-12: 100 mg via ORAL
  Filled 2014-05-12 (×2): qty 1

## 2014-05-12 MED ORDER — ACETAMINOPHEN 325 MG PO TABS
650.0000 mg | ORAL_TABLET | ORAL | Status: DC | PRN
  Administered 2014-05-13 (×2): 650 mg via ORAL
  Filled 2014-05-12 (×2): qty 2

## 2014-05-12 MED ORDER — ASPIRIN EC 81 MG PO TBEC
81.0000 mg | DELAYED_RELEASE_TABLET | Freq: Every day | ORAL | Status: DC
  Administered 2014-05-12 – 2014-05-14 (×3): 81 mg via ORAL
  Filled 2014-05-12 (×3): qty 1

## 2014-05-12 MED ORDER — FUROSEMIDE 10 MG/ML IJ SOLN
40.0000 mg | Freq: Two times a day (BID) | INTRAMUSCULAR | Status: DC
  Administered 2014-05-12: 40 mg via INTRAVENOUS
  Filled 2014-05-12 (×3): qty 4

## 2014-05-12 MED ORDER — ALBUTEROL SULFATE (2.5 MG/3ML) 0.083% IN NEBU: 3.0000 mL | INHALATION_SOLUTION | Freq: Four times a day (QID) | RESPIRATORY_TRACT | Status: DC | PRN

## 2014-05-12 MED ORDER — POTASSIUM CHLORIDE CRYS ER 20 MEQ PO TBCR
20.0000 meq | EXTENDED_RELEASE_TABLET | Freq: Two times a day (BID) | ORAL | Status: DC
  Administered 2014-05-12: 20 meq via ORAL
  Filled 2014-05-12 (×3): qty 1

## 2014-05-12 NOTE — Progress Notes (Signed)
CARE MANAGEMENT NOTE 05/12/2014  Patient:  SAMIL, SAMORA   Account Number:  [id number]  Date Initiated:  05/12/2014  Documentation initiated by:  Faustine Tates  Subjective/Objective Assessment:   PMHx significant for CAD, ischemic CMP with EF 25%, atrial fib, ICD for Vtach, who presented to [facility] on 05/12/2014 from his care facility where he had "seizures" and passing out. Pt reports that it was about 5 times.     Action/Plan:   return to alf vs snf/all outside care is through [place] in [place]   Anticipated DC Date:  05/13/2014   Anticipated DC Plan:  SKILLED NURSING FACILITY  In-house referral  Clinical Social Worker      DC Planning Services  CM consult      [facility] Choice  NA   Choice offered to / List presented to:  NA   DME arranged  NA      DME agency  NA     HH arranged  NA      HH agency  NA   Status of service:  In process, will continue to follow Medicare Important Message given?   (If response is "NO", the following Medicare IM given date fields will be blank) Date Medicare IM given:   Medicare IM given by:   Date Additional Medicare IM given:   Additional Medicare IM given by:    Discharge Disposition:    Per UR Regulation:  Reviewed for med. necessity/level of care/duration of stay  If discussed at Long Length of Stay Meetings, dates discussed:    Comments:  [id number]/Lavergne Hiltunen,RN,BSN,CCM: patient significant cardiac history/alf versus snf/open vascular wounds to the lower legs/states he has been mainly bed bound due to the wounds/will follow for any needs or transfer to [facility].

## 2014-05-12 NOTE — Progress Notes (Signed)
Pt received 40mg  IV Lasix at 0730.  By 7371 pt still with no urine via condom cath.  Bladder scan done and scanner said no urine in bladder x3.  14 Fr. Foley placed using sterile technique with 2 RN's.  660 cc of clear yellow/amber returned.  Will monitor closely.

## 2014-05-12 NOTE — Progress Notes (Signed)
INITIAL NUTRITION ASSESSMENT  DOCUMENTATION CODES Per approved criteria  -Morbid Obesity   INTERVENTION: 30 ml Prostat BID  NUTRITION DIAGNOSIS: Increased nutrient needs (protein) related to wounds as evidenced by estimated needs.   Goal: Pt to meet >/= 90% of their estimated nutrition needs   Monitor:  PO intake,   Reason for Assessment: Low Braden  68 y.o. male  Admitting Dx: <principal problem not specified>  ASSESSMENT: Pt admitted from [facility] with V. Tach arrest s/p AICD firing.   Potassium low on replacement CBG's: 188-209 - lantus, meal coverage, and SSI  No signs of fat or muscle depletion. Pt denies any recent weight or appetite changes. Pt willing to try Prostat.   Height: Ht Readings from Last 1 Encounters:  05/22/2014 5\' 7"  (1.702 m)    Weight: Wt Readings from Last 1 Encounters:  05/12/14 291 lb 3.6 oz (132.1 kg)    Ideal Body Weight: 67.2 kg   % Ideal Body Weight: 197%  Wt Readings from Last 10 Encounters:  05/12/14 291 lb 3.6 oz (132.1 kg)  05/05/14 290 lb 6.4 oz (131.725 kg)  04/30/14 298 lb 12.8 oz (135.535 kg)  04/29/14 295 lb 9.6 oz (134.083 kg)  04/27/14 292 lb 4.8 oz (132.586 kg)  10/13/13 298 lb (135.172 kg)  06/02/13 318 lb (144.244 kg)    Usual Body Weight: 295  % Usual Body Weight: 99%  BMI:  Body mass index is 45.6 kg/(m^2).  Estimated Nutritional Needs: Kcal: 2000-2200 Protein: 115-135 grams Fluid: >1.5 L/day  Skin: multiple wounds to lower extremities  Diet Order: Cardiac Meal Completion: 80%  EDUCATION NEEDS: -No education needs identified at this time   Intake/Output Summary (Last 24 hours) at 05/12/14 1507 Last data filed at 05/12/14 1400  Gross per 24 hour  Intake 1060.65 ml  Output    660 ml  Net 400.65 ml    Last BM: PTA   Labs:   Recent Labs Lab 05/18/2014 1752 05/22/2014 1759 05/12/14 0200 05/12/14 0600  NA  --  131* 139 139  K  --  3.9 2.2* 2.4*  CL  --  81* 84* 84*  CO2  --   --   42* 42*  BUN  --  49* 32* 34*  CREATININE  --  1.60* 1.38* 1.47*  CALCIUM  --   --  9.0 8.8  MG 1.7  --  2.1  --   GLUCOSE  --  188* 180* 177*    CBG (last 3)   Recent Labs  05/12/14 0215 05/12/14 0801 05/12/14 1155  GLUCAP 188* 195* 209*    Scheduled Meds: . antiseptic oral rinse  7 mL Mouth Rinse BID  . aspirin EC  81 mg Oral Daily  . atorvastatin  40 mg Oral q1800  . furosemide  40 mg Intravenous Q12H  . gabapentin  600 mg Oral TID  . insulin aspart  0-15 Units Subcutaneous TID WC  . insulin aspart  5 Units Subcutaneous TID AC  . insulin glargine  10 Units Subcutaneous QHS  . metoprolol tartrate  12.5 mg Oral BID  . pantoprazole  40 mg Oral Daily  . potassium chloride  10 mEq Intravenous Q1 Hr x 4  . sodium chloride  3 mL Intravenous Q12H    Continuous Infusions: . amiodarone 30 mg/hr (05/12/14 1245)  . heparin 1,250 Units/hr (05/12/14 1142)    Past Medical History  Diagnosis Date  . Diabetes   . Arthritis   . HBP (high blood pressure)   .  CHF (congestive heart failure)   . CAD (coronary artery disease)   . Chronic kidney disease   . Hyperlipidemia   . A-fib   . Morbid obesity   . Obstructive sleep apnea   . Chronic venous stasis dermatitis of both lower extremities   . Thrombocytopenia   . Left nephrolithiasis   . Ventricular tachycardia     Past Surgical History  Procedure Laterality Date  . Knee surgery Bilateral   . Cardiac defibrillator placement  2005    MDT ICD implanted 2005 [facility]; RV lead revision 2007 at [facility]; gen change 09/2012 MDT dual chamber ICD at [facility], LDN, CNSC [phone] Pager [phone] After Hours Pager

## 2014-05-12 NOTE — Progress Notes (Signed)
Pt placed on auto titrate CPAP with 4L O2 bleed in.

## 2014-05-12 NOTE — Progress Notes (Addendum)
   ADVANCED HF TEAM  Called to see patient due to severe hypotension and SOB  68 y/o admitted today with VT and ICD firing. EF 10-15%.Poor urine output  On exam Critically ill appearing. Pale and lethargic.  SBP 70 JVP to ear Cor RRR + s3 Lungs mild crackles AB obese NT Ext. Cool 3+ edema Lethargic mentation  Central line placed emergently. Co-ox 27%  Calculates to fick CO/CI 1.9/0.77  Assessment: 1. Profound cardiogenic shock 2. A/c systolic HF 3. Acute respiratory failure 4. VT 5. iCM EF 10-15% 6. CAD 7. 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 Hypokalemia  Plan/discussion:  Based on comorbidities and lack of social support network he is not candidate for advanced therapies. Thus will not proceed with IABP or other acute mechanical support at this time. Will try to manage with inotropes. Given hypotension will start with levophed and can add milrinone later as tolerated. Continue to supp K and mag. Add spiro to help preserve K+ during diuresis. Follow CVP and co-ox. Diurese.   Prognosis is very tenuous.   Advanced HF team is happy to assume care.  The patient is critically ill with multiple organ systems failure and requires high complexity decision making for assessment and support, frequent evaluation and titration of therapies, application of advanced monitoring technologies and extensive interpretation of multiple databases.   Critical Care Time devoted to patient care services described in this note is 45 Minutes.   Truman Hayward 6:39 PM

## 2014-05-12 NOTE — Progress Notes (Signed)
ANTICOAGULATION CONSULT NOTE - Initial Consult  Pharmacy Consult for warfarin Indication: atrial fibrillation  Allergies  Allergen Reactions  . Ciprofloxacin Other (See Comments)    RESPIRATORY DISTRESS  . Clindamycin/Lincomycin Swelling and Other (See Comments)    CHEST PAIN  . Carvedilol Diarrhea  . Spironolactone Other (See Comments)    Stopped pores in breast    Patient Measurements: Height: 5\' 7"  (170.2 cm) Weight: 291 lb 3.6 oz (132.1 kg) IBW/kg (Calculated) : 66.1  Vital Signs: Temp: 97.7 F (36.5 C) (09/15 1200) Temp src: Oral (09/15 1200) BP: 93/71 mmHg (09/15 1400) Pulse Rate: 70 (09/15 1500)  Labs:  Recent Labs  04/28/2014 1752 04/28/2014 1759 05/12/14 0019 05/12/14 0145 05/12/14 0200 05/12/14 0600 05/12/14 1052 05/12/14 1345  HGB 13.4 15.6  --   --   --   --   --   --   HCT 42.4 46.0  --   --   --   --   --   --   PLT 221  --   --   --   --   --   --   --   LABPROT  --   --  19.2*  --   --   --   --   --   INR  --   --  1.62*  --   --   --   --   --   CREATININE  --  1.60*  --   --  1.38* 1.47*  --   --   TROPONINI 0.48*  --   --  <0.30  --   --  <0.30 <0.30    Estimated Creatinine Clearance: 61.2 ml/min (by C-G formula based on Cr of 1.47).   Medical History: Past Medical History  Diagnosis Date  . Diabetes   . Arthritis   . HBP (high blood pressure)   . CHF (congestive heart failure)   . CAD (coronary artery disease)   . Chronic kidney disease   . Hyperlipidemia   . A-fib   . Morbid obesity   . Obstructive sleep apnea   . Chronic venous stasis dermatitis of both lower extremities   . Thrombocytopenia   . Left nephrolithiasis   . Ventricular tachycardia     Medications:  Scheduled:  . antiseptic oral rinse  7 mL Mouth Rinse BID  . aspirin EC  81 mg Oral Daily  . atorvastatin  40 mg Oral q1800  . furosemide  40 mg Intravenous Q12H  . gabapentin  600 mg Oral TID  . insulin aspart  0-15 Units Subcutaneous TID WC  . insulin aspart  5  Units Subcutaneous TID AC  . insulin glargine  10 Units Subcutaneous QHS  . metoprolol tartrate  12.5 mg Oral BID  . pantoprazole  40 mg Oral Daily  . potassium chloride  10 mEq Intravenous Q1 Hr x 4  . sodium chloride  3 mL Intravenous Q12H   Infusions:  . amiodarone 30 mg/hr (05/12/14 1245)  . heparin 1,250 Units/hr (05/12/14 1142)    Assessment: 68 yo m admitted on 9/15 for syncope.  Patient has a h/o afib on warfarin PTA.  Home warfarin dose is 7.5 mg Tuesday and Thursday and 5 mg every other day of the week.  Pharmacy was consulted to resume warfarin.  INR this AM was subtherapeutic at 1.62 (goal 2-3). Hgb 13.2, plts 221.  Patient is currently on a heparin drip at 1250 units/hr for bridging.  Will give 10  mg tonight x 1 and re-dose tomorrow based on trend in PT/INR.   Goal of Therapy:  INR 2-3 Monitor platelets by anticoagulation protocol: Yes   Plan:   Warfarin 10 mg x 1 tonight Daily INR Monitor INR, hgb/plts, s/s of bleeding, clinical course  Cassie L. Roseanne Reno, PharmD Clinical Pharmacy Resident Pager: [phone] 05/12/2014 3:45 PM

## 2014-05-12 NOTE — Procedures (Signed)
Central Venous Catheter Insertion Procedure Note Justin Davis [id number] Jul 07, 1946  Procedure: Insertion of Central Venous Catheter Indications: Assessment of intravascular volume  Procedure Details Consent: Risks of procedure as well as the alternatives and risks of each were explained to the (patient/caregiver).  Consent for procedure obtained. Time Out: Verified patient identification, verified procedure, site/side was marked, verified correct patient position, special equipment/implants available, medications/allergies/relevent history reviewed, required imaging and test results available.  Performed  Maximum sterile technique was used including antiseptics, cap, gloves, gown, hand hygiene, mask and sheet. Skin prep: Chlorhexidine; local anesthetic administered A antimicrobial bonded/coated triple lumen catheter was placed in the right internal jugular vein using the Seldinger technique.  Evaluation Blood flow good Complications: No apparent complications Patient did tolerate procedure well. Chest X-ray ordered to verify placement.  CXR: pending.  Arvilla Meres  MD  05/12/2014, 6:30 PM

## 2014-05-12 NOTE — Progress Notes (Signed)
Pt BP in 60's.  Pt does not feel well at present.  Pt recently felt nauseous and Zofran given.  Currently giving pt 250cc NS Bolus while waiting on MD to call back.  Spoke to [place], [place].  Notified of findings and interventions listed above.  Will continue to monitor closely.

## 2014-05-12 NOTE — Progress Notes (Signed)
Subjective:  Pt alert , cooperative, in NAD. Denies CP/SOB  Objective:  Temp:  [97.2 F (36.2 C)-97.6 F (36.4 C)] 97.4 F (36.3 C) (09/15 0400) Pulse Rate:  [27-78] 71 (09/15 0600) Resp:  [14-25] 16 (09/15 0600) BP: (64-123)/(35-99) 104/83 mmHg (09/15 0600) SpO2:  [84 %-100 %] 98 % (09/15 0600) Weight:  [291 lb 3.6 oz (132.1 kg)-295 lb (133.811 kg)] 291 lb 3.6 oz (132.1 kg) (09/15 0145) Weight change:   Intake/Output from previous day: 09/14 0701 - 09/15 0700 In: 176.2 [I.V.:176.2] Out: -   Intake/Output from this shift:    Physical Exam: General appearance: alert and no distress Neck: no adenopathy, no carotid bruit, no JVD, supple, symmetrical, trachea midline and thyroid not enlarged, symmetric, no tenderness/mass/nodules Lungs: clear to auscultation bilaterally Heart: irregularly irregular rhythm Extremities: Venous stasis changes, 1-2+ edema  Lab Results: Results for orders placed during the hospital encounter of 05/03/2014 (from the past 48 hour(s))  CBC     Status: Abnormal   Collection Time    05/02/2014  5:52 PM      Result Value Ref Range   WBC 7.4  4.0 - 10.5 K/uL   RBC 4.75  4.22 - 5.81 MIL/uL   Hemoglobin 13.4  13.0 - 17.0 g/dL   HCT 42.4  39.0 - 52.0 %   MCV 89.3  78.0 - 100.0 fL   MCH 28.2  26.0 - 34.0 pg   MCHC 31.6  30.0 - 36.0 g/dL   RDW 18.0 (*) 11.5 - 15.5 %   Platelets 221  150 - 400 K/uL  PRO B NATRIURETIC PEPTIDE     Status: Abnormal   Collection Time    05/09/2014  5:52 PM      Result Value Ref Range   Pro B Natriuretic peptide (BNP) 5088.0 (*) 0 - 125 pg/mL  HEPATIC FUNCTION PANEL     Status: Abnormal   Collection Time    05/20/2014  5:52 PM      Result Value Ref Range   Total Protein 6.4  6.0 - 8.3 g/dL   Albumin 3.0 (*) 3.5 - 5.2 g/dL   AST 34  0 - 37 U/L   Comment: HEMOLYSIS AT THIS LEVEL MAY AFFECT RESULT   ALT 23  0 - 53 U/L   Alkaline Phosphatase 135 (*) 39 - 117 U/L   Total Bilirubin 1.5 (*) 0.3 - 1.2 mg/dL   Bilirubin,  Direct 0.7 (*) 0.0 - 0.3 mg/dL   Comment: HEMOLYSIS AT THIS LEVEL MAY AFFECT RESULT   Indirect Bilirubin 0.8  0.3 - 0.9 mg/dL  TROPONIN I     Status: Abnormal   Collection Time    05/19/2014  5:52 PM      Result Value Ref Range   Troponin I 0.48 (*) <0.30 ng/mL   Comment:            Due to the release kinetics of cTnI,     a negative result within the first hours     of the onset of symptoms does not rule out     myocardial infarction with certainty.     If myocardial infarction is still suspected,     repeat the test at appropriate intervals.     CRITICAL RESULT CALLED TO, READ BACK BY AND VERIFIED WITH:     E POULOSE,RN 1933 05/10/2014 WBOND  MAGNESIUM     Status: None   Collection Time    05/23/2014  5:52 PM  Result Value Ref Range   Magnesium 1.7  1.5 - 2.5 mg/dL  I-STAT CHEM 8, ED     Status: Abnormal   Collection Time    05/24/2014  5:59 PM      Result Value Ref Range   Sodium 131 (*) 137 - 147 mEq/L   Potassium 3.9  3.7 - 5.3 mEq/L   Chloride 81 (*) 96 - 112 mEq/L   BUN 49 (*) 6 - 23 mg/dL   Creatinine, Ser 1.60 (*) 0.50 - 1.35 mg/dL   Glucose, Bld 188 (*) 70 - 99 mg/dL   Calcium, Ion 0.89 (*) 1.13 - 1.30 mmol/L   TCO2 47  0 - 100 mmol/L   Hemoglobin 15.6  13.0 - 17.0 g/dL   HCT 46.0  39.0 - 52.0 %  I-STAT TROPOININ, ED     Status: Abnormal   Collection Time    05/18/2014  6:29 PM      Result Value Ref Range   Troponin i, poc 0.13 (*) 0.00 - 0.08 ng/mL   Comment NOTIFIED PHYSICIAN     Comment 3            Comment: Due to the release kinetics of cTnI,     a negative result within the first hours     of the onset of symptoms does not rule out     myocardial infarction with certainty.     If myocardial infarction is still suspected,     repeat the test at appropriate intervals.  URINALYSIS, ROUTINE W REFLEX MICROSCOPIC     Status: Abnormal   Collection Time    05/27/2014  7:11 PM      Result Value Ref Range   Color, Urine YELLOW  YELLOW   APPearance CLEAR  CLEAR    Specific Gravity, Urine 1.008  1.005 - 1.030   pH 6.5  5.0 - 8.0   Glucose, UA NEGATIVE  NEGATIVE mg/dL   Hgb urine dipstick MODERATE (*) NEGATIVE   Bilirubin Urine NEGATIVE  NEGATIVE   Ketones, ur NEGATIVE  NEGATIVE mg/dL   Protein, ur NEGATIVE  NEGATIVE mg/dL   Urobilinogen, UA 0.2  0.0 - 1.0 mg/dL   Nitrite NEGATIVE  NEGATIVE   Leukocytes, UA NEGATIVE  NEGATIVE  URINE MICROSCOPIC-ADD ON     Status: None   Collection Time    04/29/2014  7:11 PM      Result Value Ref Range   Squamous Epithelial / LPF RARE  RARE   WBC, UA 0-2  <3 WBC/hpf   RBC / HPF 3-6  <3 RBC/hpf   Bacteria, UA RARE  RARE  PROTIME-INR     Status: Abnormal   Collection Time    05/12/14 12:19 AM      Result Value Ref Range   Prothrombin Time 19.2 (*) 11.6 - 15.2 seconds   INR 1.62 (*) 0.00 - 1.49  MRSA PCR SCREENING     Status: None   Collection Time    05/12/14  1:33 AM      Result Value Ref Range   MRSA by PCR NEGATIVE  NEGATIVE   Comment:            The GeneXpert MRSA Assay (FDA     approved for NASAL specimens     only), is one component of a     comprehensive MRSA colonization     surveillance program. It is not     intended to diagnose MRSA     infection nor to  guide or     monitor treatment for     MRSA infections.  TROPONIN I     Status: None   Collection Time    05/12/14  1:45 AM      Result Value Ref Range   Troponin I <0.30  <0.30 ng/mL   Comment:            Due to the release kinetics of cTnI,     a negative result within the first hours     of the onset of symptoms does not rule out     myocardial infarction with certainty.     If myocardial infarction is still suspected,     repeat the test at appropriate intervals.  BASIC METABOLIC PANEL     Status: Abnormal   Collection Time    05/12/14  2:00 AM      Result Value Ref Range   Sodium 139  137 - 147 mEq/L   Comment: DELTA CHECK NOTED     NO VISIBLE HEMOLYSIS   Potassium 2.2 (*) 3.7 - 5.3 mEq/L   Comment: REPEATED TO VERIFY      CRITICAL RESULT CALLED TO, READ BACK BY AND VERIFIED WITH:     A STOPHEL,RN [id number] WILDERK   Chloride 84 (*) 96 - 112 mEq/L   CO2 42 (*) 19 - 32 mEq/L   Comment: CRITICAL RESULT CALLED TO, READ BACK BY AND VERIFIED WITH:     A STOPHEL,RN [id number] WILDERK   Glucose, Bld 180 (*) 70 - 99 mg/dL   BUN 32 (*) 6 - 23 mg/dL   Comment: DELTA CHECK NOTED   Creatinine, Ser 1.38 (*) 0.50 - 1.35 mg/dL   Calcium 9.0  8.4 - 10.5 mg/dL   GFR calc non Af Amer 50 (*) >90 mL/min   GFR calc Af Amer 58 (*) >90 mL/min   Comment: (NOTE)     The eGFR has been calculated using the CKD EPI equation.     This calculation has not been validated in all clinical situations.     eGFR's persistently <90 mL/min signify possible Chronic Kidney     Disease.   Anion gap 13  5 - 15  MAGNESIUM     Status: None   Collection Time    05/12/14  2:00 AM      Result Value Ref Range   Magnesium 2.1  1.5 - 2.5 mg/dL  GLUCOSE, CAPILLARY     Status: Abnormal   Collection Time    05/12/14  2:15 AM      Result Value Ref Range   Glucose-Capillary 188 (*) 70 - 99 mg/dL  BASIC METABOLIC PANEL     Status: Abnormal   Collection Time    05/12/14  6:00 AM      Result Value Ref Range   Sodium 139  137 - 147 mEq/L   Potassium 2.4 (*) 3.7 - 5.3 mEq/L   Comment: CRITICAL RESULT CALLED TO, READ BACK BY AND VERIFIED WITH:     [facility] 0736 05/12/14 CLARK,S   Chloride 84 (*) 96 - 112 mEq/L   CO2 42 (*) 19 - 32 mEq/L   Comment: CRITICAL RESULT CALLED TO, READ BACK BY AND VERIFIED WITH:     M.ANDE,RN 0736 05/12/14 CLARK,S   Glucose, Bld 177 (*) 70 - 99 mg/dL   BUN 34 (*) 6 - 23 mg/dL   Creatinine, Ser 1.47 (*) 0.50 - 1.35 mg/dL   Calcium 8.8  8.4 - 10.5 mg/dL  GFR calc non Af Amer 47 (*) >90 mL/min   GFR calc Af Amer 54 (*) >90 mL/min   Comment: (NOTE)     The eGFR has been calculated using the CKD EPI equation.     This calculation has not been validated in all clinical situations.     eGFR's persistently <90 mL/min  signify possible Chronic Kidney     Disease.   Anion gap 13  5 - 15  GLUCOSE, CAPILLARY     Status: Abnormal   Collection Time    05/12/14  8:01 AM      Result Value Ref Range   Glucose-Capillary 195 (*) 70 - 99 mg/dL   Comment 1 Capillary Sample      Imaging: Imaging results have been reviewed  Assessment/Plan:   1. Active Problems: 2.   V-tach 3. NISCM 4. CAF 5. Coumadin AC 6. IDDM 7. Stasis dermatitis 8. ICD D/C 9. Decreased K 10. CHF 11.   Time Spent Directly with Patient:  30 minutes  Length of Stay:  LOS: 1 day   Pt admitted with ICD D/C. [facility]. Prob NISCM. H/O ICD, CAF on coumadin AC. Pt at SNF [facility] place)  Because of debility and inability to ambulate. No CP. Had LOC with documented ICD D/C. 100% Afib on interrogation. On amio drip now. K+ 2.4 ---> repletion (? Etiology of Vent arrhythmias).  Mg OK. Will get TRH to assist, EP consult. Obtain old records from [place]. Start IV hep since INR sub therapeutic.Follow lytes. Continue diueretics (BNP 5000, Trop .88 prob secondary to CHF)  Bryam Taborda J 05/12/2014, 8:53 AM

## 2014-05-12 NOTE — Progress Notes (Signed)
Dr. Gala Romney at bedside placing central line.

## 2014-05-12 NOTE — Consult Note (Signed)
Note: This document was prepared with digital dictation and possible smart phrase technology. Any transcriptional errors that result from this process are unintentional.   Justin Davis QMV:[id number] DOB: 01/06/1946 DOA: 2014/05/31 PCP: No PCP Per Patient  Brief narrative: 68 y/o [place] [facility] resident ? h/o ICM + CHB s/p [employer] BiV defibrillator placed @ [address], Episodic [place], OSA [on cpap 17 cm] + restrictive component, Htn, DM ty 2 on insulin, Chronic Afib on [facility] admitted 05/12/14  with LOC as well as documented ICD discharge and found on device interrogation to be in Afib.  Found to be have elevated troponin on admit 0.48-->0.30 profoundly hypokalemic K=2.2, Bun/Creat 49/1.6.  CXR = mild cardiomegally  Cardiology admitted patient and placed on amiodarone drip as well as low-dose beta blocker, diuretics. It was thought at his arrhythmia was secondary to electrolyte abnormalities which are being repleted Hospitalist service consulted for help with management of diabetes and lower extremity wounds  Past medical history-As per Problem list Chart reviewed as below- Reviewed as much as possible with the notes-await records from [place]  Consultants:  none  Procedures:  Echocardiogram pending  Antibiotics:  None   Subjective  Feels fine. No fever no chills no nausea no vomiting no current chest pain No further loss of consciousness No weakness unilaterally no numbness however he has some chronic blurred vision  No further episodes of weakness lost consciousness. Tolerating diet at bedside   Objective    Interim History:   Telemetry: Pacer spikes interspersed with multiple PVCs   Objective: Filed Vitals:   05/12/14 0415 05/12/14 0445 05/12/14 0500 05/12/14 0600  BP: 87/57 101/77 92/70 104/83  Pulse: 27 78 62 71  Temp:      TempSrc:      Resp: Height:      Weight:      SpO2: 100% 97% 97% 98%    Intake/Output Summary (Last 24 hours) at 05/12/14  0859 Last data filed at 05/12/14 0600  Gross per 24 hour  Intake  176.2 ml  Output      0 ml  Net  176.2 ml    Exam:  General: Alert pleasant oriented no apparent distress Cardiovascular: S1-S2 irregularly irregular pacer insertion of left chest, JVD elevated at least 6-8 cm Respiratory: Clinically clear no added sound Abdomen: Soft nontender nondistended no rebound no guarding Skin lower extremity distended grade 3 edema/anasarca-multiple venous stasis ulcers right and left lower extremities however all seem relatively clean with very shallow base and floor Neuro grossly intact moving all 4 limbs equally sensory intact to  Data Reviewed: Basic Metabolic Panel:  Recent Labs Lab 05-31-14 1752  05/31/2014 1759 05/12/14 0200 05/12/14 0600  NA  --   --  131* 139 139  K  --   < > 3.9 2.2* 2.4*  CL  --   --  81* 84* 84*  CO2  --   --   --  42* 42*  GLUCOSE  --   --  188* 180* 177*  BUN  --   --  49* 32* 34*  CREATININE  --   --  1.60* 1.38* 1.47*  CALCIUM  --   --   --  9.0 8.8  MG 1.7  --   --  2.1  --   < > = values in this interval not displayed. Liver Function Tests:  Recent Labs Lab 2014-05-31 1752  AST 34  ALT 23  ALKPHOS 135*  BILITOT 1.5*  PROT 6.4  ALBUMIN 3.0*   No results found for this basename: LIPASE, AMYLASE,  in the last 168 hours No results found for this basename: AMMONIA,  in the last 168 hours CBC:  Recent Labs Lab 05/24/2014 1752 05/08/2014 1759  WBC 7.4  --   HGB 13.4 15.6  HCT 42.4 46.0  MCV 89.3  --   PLT 221  --    Cardiac Enzymes:  Recent Labs Lab 05/26/2014 1752 05/12/14 0145  TROPONINI 0.48* <0.30   BNP: No components found with this basename: POCBNP,  CBG:  Recent Labs Lab 05/12/14 0215 05/12/14 0801  GLUCAP 188* 195*    Recent Results (from the past 240 hour(s))  MRSA PCR SCREENING     Status: None   Collection Time    05/12/14  1:33 AM      Result Value Ref Range Status   MRSA by PCR NEGATIVE  NEGATIVE Final    Comment:            The GeneXpert MRSA Assay (FDA     approved for NASAL specimens     only), is one component of a     comprehensive MRSA colonization     surveillance program. It is not     intended to diagnose MRSA     infection nor to guide or     monitor treatment for     MRSA infections.     Studies:              All Imaging reviewed and is as per above notation   Scheduled Meds: . amiodarone      . antiseptic oral rinse  7 mL Mouth Rinse BID  . aspirin EC  81 mg Oral Daily  . atorvastatin  40 mg Oral q1800  . furosemide  40 mg Intravenous Q12H  . gabapentin  600 mg Oral TID  . insulin aspart  0-15 Units Subcutaneous TID WC  . insulin aspart  5 Units Subcutaneous TID AC  . insulin glargine  10 Units Subcutaneous QHS  . metoprolol      . metoprolol tartrate  12.5 mg Oral BID  . pantoprazole  40 mg Oral Daily  . potassium chloride  40 mEq Oral Once  . potassium chloride  40 mEq Oral Once  . sodium chloride  3 mL Intravenous Q12H   Continuous Infusions: . amiodarone 30 mg/hr (05/12/14 0800)     Assessment/Plan: 1. V. tach arrest status post AICD firing/elevated troponin/likely decompensated heart failure/ chronic atrial fibrillation-defer management to primary team. Monitor electrolytes as well as magnesium.  Will get a baseline TSH as will be on amiodarone-he has a hemolyzed complete metabolic panel so we will repeat this.  Currently on IV heparin as his INR level is 1.6 2. Likely contraction Alkalosis-probably from diuresis, as chloride is also low. Unclear baseline. Monitor labs in a.m. 3. Obstructive sleep apnea-CPAP at night has been ordered 4. Mild hypotension-may benefit from slightly lower dose of metoprolol vs. just amiodarone monotherapy. Defer management to cardiology 5. 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 Has been on insulin 32 units Lantus in the past in addition to glipizide in the  morning. We will hold off on glipizide  and continue sliding scale coverage in addition to her scheduled Lantus 10 units as well as 3 times a day mealtime coverage.  He does not need an A1c at this time this can be followed  as an outpatient  6.  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 Patient is on IV Lasix 40 mg every 12 per cardiology 7. Lower extremity wound-patient has had this for at least several months and has been on different and Plavix. He is to followup with wound care at the [facility] but this is now managed at this nursing facility. Wound care recommends foam dressings to absorb drainage and promote healing  Code Status: Full  Family Communication: Family at bedside  Disposition Plan: Inpatient, step down unit   we will follow this patient for one more day unit, follow labs and glycemic control  Otherwise cardiology is doing an excellent job of managing most of his medical issues    Pleas Koch, MD  [facility] Pager [phone] 05/12/2014, 8:59 AM    LOS: 1 day

## 2014-05-12 NOTE — Progress Notes (Signed)
ANTICOAGULATION CONSULT NOTE - Follow Up Consult  Pharmacy Consult for Heparin Indication: atrial fibrillation  Allergies  Allergen Reactions  . Ciprofloxacin Other (See Comments)    RESPIRATORY DISTRESS  . Clindamycin/Lincomycin Swelling and Other (See Comments)    CHEST PAIN  . Carvedilol Diarrhea  . Spironolactone Other (See Comments)    Stopped pores in breast    Patient Measurements: Height:  (170.2 cm) Weight: 291 lb 3.6 oz (132.1 kg) IBW/kg (Calculated) : 66.1 Heparin Dosing Weight: 97 kg  Vital Signs: Temp: 98.2 F (36.8 C) (09/15 1600) Temp src: Oral (09/15 1600) BP: 77/52 mmHg (09/15 1745) Pulse Rate: 67 (09/15 1745)  Labs:  Recent Labs  05/22/2014 1752 05/13/2014 1759 05/12/14 0019 05/12/14 0145 05/12/14 0200 05/12/14 0600 05/12/14 1052 05/12/14 1345 05/12/14 1655  HGB 13.4 15.6  --   --   --   --   --   --   --   HCT 42.4 46.0  --   --   --   --   --   --   --   PLT 221  --   --   --   --   --   --   --   --   LABPROT  --   --  19.2*  --   --   --   --   --   --   INR  --   --  1.62*  --   --   --   --   --   --   HEPARINUNFRC  --   --   --   --   --   --   --   --  0.25*  CREATININE  --  1.60*  --   --  1.38* 1.47*  --   --   --   TROPONINI 0.48*  --   --  <0.30  --   --  <0.30 <0.30  --     Estimated Creatinine Clearance: 61.2 ml/min (by C-G formula based on Cr of 1.47).   Medications:  Scheduled:  . antiseptic oral rinse  7 mL Mouth Rinse BID  . aspirin EC  81 mg Oral Daily  . atorvastatin  40 mg Oral q1800  . feeding supplement (PRO-STAT SUGAR FREE 64)  30 mL Oral BID PC  . furosemide  40 mg Intravenous Q12H  . gabapentin  600 mg Oral TID  . insulin aspart  0-15 Units Subcutaneous TID WC  . insulin aspart  5 Units Subcutaneous TID AC  . insulin glargine  10 Units Subcutaneous QHS  . metoprolol tartrate  12.5 mg Oral BID  . pantoprazole  40 mg Oral Daily  . potassium chloride  10 mEq Intravenous Q1 Hr x 4  . sodium chloride  3 mL  Intravenous Q12H  . warfarin  10 mg Oral ONCE-1800  . Warfarin - Pharmacist Dosing Inpatient   Does not apply q1800   Infusions:  . amiodarone 30 mg/hr (05/12/14 1245)  . heparin 1,250 Units/hr (05/12/14 1142)  . norepinephrine (LEVOPHED) Adult infusion      Assessment: 68 yo M on heparin for afib while INR subtherapeutic.  Pt resumed Coumadin this evening.  Heparin level is sub therapeutic on 1250 units/hr.  Will increase heparin infusion 2 units/kg/hr according to nomogram.  Goal of Therapy:  Heparin level 0.3-0.7 units/ml Monitor platelets by anticoagulation protocol: Yes   Plan:  Increase heparin infusion to 1450 units/hr. Recheck heparin level in 6  hours. Continue daily heparin level and CBC.  [employer], Pharm.D., BCPS Clinical Pharmacist Pager [phone] 05/12/2014 5:59 PM

## 2014-05-12 NOTE — Consult Note (Signed)
ELECTROPHYSIOLOGY CONSULT NOTE    Patient ID: Justin Davis MRN: [id number], DOB/AGE: 09-11-1945 68 y.o.  Admit date: May 25, 2014 Date of Consult: 05-12-14  Primary Physician: No PCP Per Patient Primary Cardiologist: [facility]  Reason for Consultation: ICD shocks  HPI:  Justin Davis is a 68 y.o. male with a past medical history significant for CAD, ischemic cardiomyopathy (EF 25%), permanent atrial fibrillation, diabetes, hypertension, chronic kidney disease, and obesity.  He is primarily followed by the [facility].    3 weeks ago, he was admitted to SNF for deconditioning.  He has had problems with increased shortness of breath and LE edema and his diuretics have been adjusted.  On the day of admission, he was reported to have seizures and syncope and he was transferred to [facility] for further evaluation.  He does not recall ICD shocks.    Initial ICD implantation in 2005.  He reports a total of 4 device procedures since that time.  Underwent RV lead extraction and placement of RA lead in 2007 by Dr Deno Lunger at [facility] due to [phone] lead fracture.  Current device implatned 09-2012 at [facility] (MDT Evera XT DR) with 0865 RA lead and 6947 RV lead.  There are comments in note from 2007 at [facility] about LV lead; however, patient currently has dual chamber device implanted.  Records from the [facility] have been requested.    Device interrogation today demonstrates MDT Lianne Moris Dr ICD - 6.5 years estimated longevity, RA 5076 lead impedence 437, P wave 7.61mV, no threshold; RV 6947 lead impedence 399, R wave 2.3 (PVC's - chronically around 9mV), threshold 1V@0 . .  99.1% atrial fibrillation since last interrogation 02-12-14.  11 episodes in VF zone terminated with HV shock.  First episodes 05-10-14 at 10:55am (pt has no recollection), second episode 2014/05/25 at 6:14AM (pt has no recollection), 9 more episodes on 05/25/2014 between 3PM and 1:38AM this morning.  All episodes appropriate therapy for polymorphic VT/VF.     He denies recent chest pain, fevers, chills, nausea, vomiting, diarrhea.  He has chronic shortness of breath and has been on O2 supplementation since admission to SNF 3 weeks ago.  He also has chronic LE edema with chronic venous stasis ulcers.   Lab work on admission is notable for K of 2.2,  INR 1.62, postiive POC troponin, but negative by lab drawn, CO2 42, creat 1.47.  Echo pending this admission.    EP has been asked to evaluate for treatment options.  ROS is negative except as outlined above.   Past Medical History  Diagnosis Date  . Diabetes   . Arthritis   . HBP (high blood pressure)   . CHF (congestive heart failure)   . CAD (coronary artery disease)   . Chronic kidney disease   . Hyperlipidemia   . A-fib   . Morbid obesity   . Obstructive sleep apnea   . Chronic venous stasis dermatitis of both lower extremities   . Thrombocytopenia   . Left nephrolithiasis   . Ventricular tachycardia      Surgical History:  Past Surgical History  Procedure Laterality Date  . Knee surgery Bilateral   . Cardiac defibrillator placement  2005    MDT ICD implanted 2005 [facility]; RV lead revision 2007 at [facility]; gen change 09/2012 MDT dual chamber ICD at [facility]     Prescriptions prior to admission  Medication Sig Dispense Refill  . albuterol (PROVENTIL HFA;VENTOLIN HFA) 108 (90 BASE) MCG/ACT inhaler Inhale 2  puffs into the lungs every 6 (six) hours as needed for shortness of breath.       Marland Kitchen aspirin 81 MG tablet Take 81 mg by mouth daily.      Marland Kitchen docusate sodium (COLACE) 100 MG capsule Take 100 mg by mouth 2 (two) times daily as needed for mild constipation.      . flunisolide (NASAREL) 29 MCG/ACT (0.025%) nasal spray Place 2 sprays into the nose 2 (two) times daily as needed for rhinitis or allergies. Dose is for each nostril.      . furosemide (LASIX) 40 MG tablet Take 1 tablet (40 mg total) by mouth daily.  30 tablet  11  . gabapentin (NEURONTIN) 600 MG tablet Take 600 mg by mouth 3  (three) times daily.      Marland Kitchen glipiZIDE (GLUCOTROL XL) 2.5 MG 24 hr tablet Take 2.5 mg by mouth daily with breakfast.      . insulin aspart (NOVOLOG) 100 UNIT/ML injection Inject 5 Units into the skin 3 (three) times daily before meals. For cbg >=150  3 vial  PRN  . insulin glargine (LANTUS) 100 UNIT/ML injection Inject 0.1 mLs (10 Units total) into the skin at bedtime.  10 mL  11  . omeprazole (PRILOSEC) 20 MG capsule Take 20 mg by mouth 2 (two) times daily before a meal.       . oxyCODONE (OXY IR/ROXICODONE) 5 MG immediate release tablet Take 1 tablet (5 mg total) by mouth every 6 (six) hours as needed for severe pain.  30 tablet  0  . potassium chloride SA (K-DUR,KLOR-CON) 20 MEQ tablet Take 20 mEq by mouth 2 (two) times daily.      . rosuvastatin (CRESTOR) 10 MG tablet Take 10 mg by mouth every morning.       . warfarin (COUMADIN) 5 MG tablet Take 5-7.5 mg by mouth daily at 6 PM. Takes 7.5mg  on tues and thurs Takes 5mg  all other days      . nitroGLYCERIN (NITROSTAT) 0.4 MG SL tablet Place 0.4 mg under the tongue every 5 (five) minutes as needed for chest pain.        Inpatient Medications:  . amiodarone      . antiseptic oral rinse  7 mL Mouth Rinse BID  . aspirin EC  81 mg Oral Daily  . atorvastatin  40 mg Oral q1800  . furosemide  40 mg Intravenous Q12H  . gabapentin  600 mg Oral TID  . heparin  2,000 Units Intravenous Once  . insulin aspart  0-15 Units Subcutaneous TID WC  . insulin aspart  5 Units Subcutaneous TID AC  . insulin glargine  10 Units Subcutaneous QHS  . metoprolol      . metoprolol tartrate  12.5 mg Oral BID  . pantoprazole  40 mg Oral Daily  . potassium chloride  40 mEq Oral Once  . potassium chloride  40 mEq Oral Once  . sodium chloride  3 mL Intravenous Q12H    Allergies:  Allergies  Allergen Reactions  . Ciprofloxacin Other (See Comments)    RESPIRATORY DISTRESS  . Clindamycin/Lincomycin Swelling and Other (See Comments)    CHEST PAIN  . Carvedilol  Diarrhea  . Spironolactone Other (See Comments)    Stopped pores in breast    History   Social History  . Marital Status: Divorced    Spouse Name: N/A    Number of Children: N/A  . Years of Education: N/A   Occupational History  . Not  on file.   Social History Main Topics  . Smoking status: Former Smoker    Types: Cigarettes    Quit date: 06/03/1983  . Smokeless tobacco: Never Used  . Alcohol Use: No     Comment: STOPPED DRINKING ALCOHOL 2004  . Drug Use: No  . Sexual Activity: No   Other Topics Concern  . Not on file   Social History Narrative  . No narrative on file     Family History:  + diabetes (Mother)  BP 100/70  Pulse 47  Temp(Src) 97.4 F (36.3 C) (Oral)  Resp 18  Ht  (1.702 m)  Wt 291 lb 3.6 oz (132.1 kg)  BMI 45.60 kg/m2  SpO2 99%   Physical Exam: Filed Vitals:   05/12/14 1200 05/12/14 1244 05/12/14 1344 05/12/14 1400  BP:    93/71  Pulse: 63 69 67 92  Temp: 97.7 F (36.5 C)     TempSrc: Oral     Resp: Height:      Weight:      SpO2: 93% 99% 97% 98%    GEN- The patient is chronically ill appearing, alert and oriented x 3 today.   Head- normocephalic, atraumatic Eyes-  Sclera clear, conjunctiva pink Ears- hearing intact Oropharynx- clear Neck- supple, Lungs- Clear to ausculation bilaterally, normal work of breathing Heart- irregular rate and rhythm  GI- soft, NT, ND, + BS Extremities- no clubbing, cyanosis, + dependant edema MS-diffuse muscle atrophy Skin- multiple wounds on his legs Psych- euthymic mood, full affect Neuro-  Decreased strength in both legs    Labs:   Lab Results  Component Value Date   WBC 7.4 04/30/2014   HGB 15.6 05/01/2014   HCT 46.0 05/24/2014   MCV 89.3 05/02/2014   PLT 221 05/06/2014    Recent Labs Lab 05/02/2014 1752  05/12/14 0600  NA  --   < > 139  K  --   < > 2.4*  CL  --   < > 84*  CO2  --   < > 42*  BUN  --   < > 34*  CREATININE  --   < > 1.47*  CALCIUM  --   < > 8.8    PROT 6.4  --   --   BILITOT 1.5*  --   --   ALKPHOS 135*  --   --   ALT 23  --   --   AST 34  --   --   GLUCOSE  --   < > 177*  < > = values in this interval not displayed. Lab Results  Component Value Date   TROPONINI <0.30 05/12/2014    Radiology/Studies: Dg Chest Portable 1 View 05/07/2014   CLINICAL DATA:  Multiple syncopal episodes.  EXAM: PORTABLE CHEST - 1 VIEW  COMPARISON:  Chest radiograph 04/21/2014  FINDINGS: Left-sided pacemaker overlies stable enlarged cardiac silhouette. Low lung volumes similar prior. Central venous congestion is present.  IMPRESSION: Mild increase in central pulmonary venous congestion. Cardiomegaly and low lung volumes similar prior.   Electronically Signed   By: Genevive Bi M.D.   On: 05/19/2014 18:18    EKG:  Afib with very frequent ventricular ectopy (9 different QRS morphologies on a single ekg).  Wide QRS and prolonged QT are noted  TELEMETRY: atrial fibrillation with ventricular pacing, intermittent sinus beats, PVC's  A/P  1. VT/VF The patient presents with recurrent sustained VF for which he has received multiple ICD  shocks.  I have reviewed his ICD interrogation which reveals normal device function.  The patient does not have ischemic symptoms and his Tn has not been significantly elevated.  In the setting of a very low potassium, I would suspect that his profound hypokalemia is the cause for his recurrent ventricular arrhythmias. I think that the most important strategy at this point is repletion of electrolytes. He has been started on amiodarone.  This could potentially be discontinued once his K is replete if his rhythm becomes more stable.  2. Hypokalemia Aggressive K repletion is advised I will give IV potassium.  Repeat BMET at 2000 this evening.  If K < 4, would give additional IV potassium  3. Acute on chronic systolic and diastolic dysfunction He has advanced heart failure.  He will need aggressive CHF medicines once more  stable. Add beta blocker once able given ventricular arrhythmia  4. Permanent afib Would resume coumadin.  Presently on IV heparin for INR < 2.  This patient has a very poor prognosis long term.  He presently wishes to be full code.  General cardiology to manage multiple cardiovascular issues. Electrophysiology team to see as needed while here. Please call with questions.

## 2014-05-12 NOTE — Consult Note (Addendum)
WOC wound consult note Reason for Consult: Consult requested for bilat legs.  Pt states he had cellulitis last week and this has evolved to blistering in several locations.   Wound type: Partial thickness wounds Measurement: Left knee with several previous partial thickness wounds which have dried and healed at this time.  Few patchy areas of dry scabbing. Left leg with blister which has ruptured and evolved into pink moist partial thickness wound, 4X4X.1cm Left lower leg with partial thickness stasis ulcer, 4X2X.1cm, 15% yellow, 85% pink, small amt yellow drainage, no odor. Right knee with several blisters which have ruptured and evolved into partial thickness wounds; affected area is 5X5cm. Right calf with 6X4X.1cm blister which has ruptured and evolved into partial thickness wound. Right lower leg with partial thickness stasis ulcer, 6X4X.1cm, 20% yellow, 80% pink, small amt yellow drainage, no odor. Right 3rd toe with blister 3X1cm, red and moist. Bilat plantar feet with dry peeling skin. Dressing procedure/placement/frequency: Foam dressings to absorb drainage and promote healing. Please re-consult if further assistance is needed.  Thank-you,  Cammie Mcgee MSN, RN, CWOCN, [place], CNS [phone]

## 2014-05-12 NOTE — H&P (Signed)
History and Physical  Patient ID: Justin Davis MRN: [id number], SOB: February 02, 1946 68 y.o. Date of Encounter: 05/12/2014, 12:04 AM  Primary Physician: No PCP Per Patient Primary Cardiologist: care at [facility]  Chief Complaint: syncope  HPI: 68 y.o. male w/ PMHx significant for CAD, ischemic CMP with EF 25%, atrial fib, ICD for Vtach, who presented to [facility] on 05/12/2014 from his care facility where he had "seizures" and passing out. Pt reports that it was about 5 times. He doesn't recall getting shocked. Denies any prodrome or palpitations prior to the shocks. Occurred while at rest/sitting. He is essentially nonambulatory. He denies any chest pain before or after the events. No injuries from the events.   He reports prior history of getting shocked multiple times but in the setting of lead fracture. Limited information available as he gets all of his care at the [facility]. I asked the ED to get outside records.  He has been in the care of SNF for the past several weeks. Has been having difficulties with lower extremity infection/cellulitis as well as congestive heart failure. Limited notes available demonstrate attempts at diuresis while at the facitlity with increased lasix and metoloazone.  Device interrogated by [employer] shows afib burden of essentially 100%. Optivol shows fluid overload issues dating back to May of this year. Had 6 shock terminated episodes. At least one of them appears to be consistent with Kirby Funk; others suggest of rapid afib?  Currently, he feels back to baseline.    EKG revealed atrial fibrillation with several different morphologies of wide complex ventricular beats. CXR was without acute cardiopulmonary abnormalities. Labs are significant for mildly elevated istat troponin at 0.13, Mg 1.7, BNP 5000, Cr 1.6, K 3.9    Past Medical History  Diagnosis Date  . Diabetes   . Swelling     FEET AND LEGS  . Arthritis   . HBP (high blood pressure)   . Bruises easily    . CHF (congestive heart failure)   . CAD (coronary artery disease)   . Chronic kidney disease   . Hyperlipidemia   . A-fib   . Morbid obesity   . Obstructive sleep apnea   . Chronic venous stasis dermatitis of both lower extremities   . Thrombocytopenia   . Left nephrolithiasis      Surgical History:  Past Surgical History  Procedure Laterality Date  . Cardiac surgery    . Knee surgery Bilateral   . Cardiac defibrillator placement       Home Meds: See face sheet from care facility.   Allergies:  Allergies  Allergen Reactions  . Ciprofloxacin Other (See Comments)    RESPIRATORY DISTRESS  . Clindamycin/Lincomycin Swelling and Other (See Comments)    CHEST PAIN  . Carvedilol Diarrhea  . Spironolactone Other (See Comments)    Stopped pores in breast    History   Social History  . Marital Status: Divorced    Spouse Name: N/A    Number of Children: N/A  . Years of Education: N/A   Occupational History  . Not on file.   Social History Main Topics  . Smoking status: Former Smoker    Types: Cigarettes    Quit date: 06/03/1983  . Smokeless tobacco: Never Used  . Alcohol Use: No     Comment: STOPPED DRINKING ALCOHOL 2004  . Drug Use: No  . Sexual Activity: Not on file   Other Topics Concern  . Not on file   Social History Narrative  .  No narrative on file     No family history on file.  Review of Systems: General: negative for chills, fever, night sweats or weight changes.  Cardiovascular: see HPI Dermatological: LE infection vs. stasis Respiratory: negative for cough or wheezing Urologic: negative for hematuria Abdominal: negative for nausea, vomiting, diarrhea, bright red blood per rectum, melena, or hematemesis Neurologic: negative for visual changes, syncope, or dizziness All other systems reviewed and are otherwise negative except as noted above.  Labs:   Lab Results  Component Value Date   WBC 7.4 2014/05/13   HGB 15.6 May 13, 2014   HCT 46.0  13-May-2014   MCV 89.3 May 13, 2014   PLT 221 2014-05-13    Recent Labs Lab 13-May-2014 1752 05/13/14 1759  NA  --  131*  K  --  3.9  CL  --  81*  BUN  --  49*  CREATININE  --  1.60*  PROT 6.4  --   BILITOT 1.5*  --   ALKPHOS 135*  --   ALT 23  --   AST 34  --   GLUCOSE  --  188*    Recent Labs  05-13-14 1752  TROPONINI 0.48*   No results found for this basename: CHOL, HDL, LDLCALC, TRIG   No results found for this basename: DDIMER    Radiology/Studies:  Dg Chest Portable 1 View  05-13-14   CLINICAL DATA:  Multiple syncopal episodes.  EXAM: PORTABLE CHEST - 1 VIEW  COMPARISON:  Chest radiograph 04/21/2014  FINDINGS: Left-sided pacemaker overlies stable enlarged cardiac silhouette. Low lung volumes similar prior. Central venous congestion is present.  IMPRESSION: Mild increase in central pulmonary venous congestion. Cardiomegaly and low lung volumes similar prior.   Electronically Signed   By: Genevive Bi M.D.   On: 13-May-2014 18:18      Physical Exam: Blood pressure 120/83, pulse 39, temperature 97.6 F (36.4 C), temperature source Oral, resp. rate 17, height 5\' 7"  (1.702 m), weight 133.811 kg (295 lb), SpO2 98.00%. General: morbidly obese, NAD Head: Normocephalic, atraumatic, sclera non-icteric, nares are without discharge Neck: Supple. Negative for carotid bruits. JVD not able to be visualized due to body habitus Lungs: anterior exam only- clear Heart: distant heart sounds, irregular Abdomen:nontender, massive panniculus Msk:  Reduced strength bilaterally in LE (he reports old) Extremities: R>L edema, reports old. No clubbing or cyanosis. Distal pedal pulses are 2+ and equal bilaterally. Neuro: Alert and oriented X 3. Moves all extremities spontaneously. Psych:  Responds to questions appropriately with a normal affect.    ASSESSMENT AND PLAN:  Problem List 1. ICD shocks x 7, at least several appropriate, some maybe for afib with RVR 2. Ischemic CMP with EF  25% 3. Acute on chronic systolic heart failure 4. Atrial fibrillation 5. Morbid obesity 6. Diabetes 7. LE wounds vs. Cellulitis 8. Chronic renal insufficiency, baseline ~1.5 9. Chronic anticoagulation  68 y.o. male w/ PMHx significant for CAD, ischemic CMP with EF 25%, atrial fib, ICD for Vtach, who presented to [facility] on 05/12/2014 from his care facility where at least several episodes of Vtach s/p ICD therapy.  Review of the interrogation strips demonstrate that at least several of the shocks were appropriate for Vtach (clear A-V dissociation) whereas some MAY have been due to atrial fibrillation with RVR. Has known severely reduced EF as well as history of ICD therapy (though may be due to lead failure). Most likely trigger is his decompensated heart failure though ischemia/scar could also be responsible. Has lot  of ectopy/different morphology on telemetry indicating that he has substrate for arrhythmias. Ischemia is possible but elevated troponin more likely from the multiple ICD therapies. Will continue amiodarone (presumably for prior h/o Vtach) and add low dose beta blocker (unclear why he is not on this; noted "diarrhea" allergery to carvedilol). Replete mg to > 2.  Continue diuresis with IV lasix. His CHF regimen is far from optimized for unknown reasons. Adding BB. Assume no ACEI due to renal insufficiency.  Regarding the elevated troponin, doubt that this is trully due to plaque rupture and more likely from ICD therapies. Continue aspirin, statin and add beta blocker. No heparin due to coumadin (INR pending).  Atrial fibrillation- on coumadin. INR pending, and then can re-start coumadin.  Diabetes- continue outpatient insulin regimen with addition of sliding scale.  LE wounds- wound care consult.  Renal function appears to be at baseline.  Continue home gabapentin and oxycodone.  Full code.  Outside records requested from ER pending hopefully.  Signed, Jeslyn Amsler,  Janea Schwenn C. MD 05/12/2014, 12:04 AM

## 2014-05-12 NOTE — Progress Notes (Signed)
Echocardiogram 2D Echocardiogram has been performed.  Justin Davis 05/12/2014, 1:29 PM

## 2014-05-12 NOTE — Progress Notes (Signed)
ANTICOAGULATION CONSULT NOTE - Initial Consult  Pharmacy Consult for heparin Indication: atrial fibrillation  Allergies  Allergen Reactions  . Ciprofloxacin Other (See Comments)    RESPIRATORY DISTRESS  . Clindamycin/Lincomycin Swelling and Other (See Comments)    CHEST PAIN  . Carvedilol Diarrhea  . Spironolactone Other (See Comments)    Stopped pores in breast    Patient Measurements: Height: 5\' 7"  (170.2 cm) Weight: 291 lb 3.6 oz (132.1 kg) IBW/kg (Calculated) : 66.1 Heparin Dosing Weight: 97 kg  Vital Signs: Temp: 97.4 F (36.3 C) (09/15 0400) Temp src: Oral (09/15 0400) BP: 104/83 mmHg (09/15 0600) Pulse Rate: 71 (09/15 0600)  Labs:  Recent Labs  05/25/2014 1752 05/18/2014 1759 05/12/14 0019 05/12/14 0145 05/12/14 0200 05/12/14 0600  HGB 13.4 15.6  --   --   --   --   HCT 42.4 46.0  --   --   --   --   PLT 221  --   --   --   --   --   LABPROT  --   --  19.2*  --   --   --   INR  --   --  1.62*  --   --   --   CREATININE  --  1.60*  --   --  1.38* 1.47*  TROPONINI 0.48*  --   --  <0.30  --   --     Estimated Creatinine Clearance: 61.2 ml/min (by C-G formula based on Cr of 1.47).   Medical History: Past Medical History  Diagnosis Date  . Diabetes   . Swelling     FEET AND LEGS  . Arthritis   . HBP (high blood pressure)   . Bruises easily   . CHF (congestive heart failure)   . CAD (coronary artery disease)   . Chronic kidney disease   . Hyperlipidemia   . A-fib   . Morbid obesity   . Obstructive sleep apnea   . Chronic venous stasis dermatitis of both lower extremities   . Thrombocytopenia   . Left nephrolithiasis     Medications:  Scheduled:  . amiodarone      . antiseptic oral rinse  7 mL Mouth Rinse BID  . aspirin EC  81 mg Oral Daily  . atorvastatin  40 mg Oral q1800  . furosemide  40 mg Intravenous Q12H  . gabapentin  600 mg Oral TID  . insulin aspart  0-15 Units Subcutaneous TID WC  . insulin aspart  5 Units Subcutaneous TID AC  .  insulin glargine  10 Units Subcutaneous QHS  . metoprolol      . metoprolol tartrate  12.5 mg Oral BID  . pantoprazole  40 mg Oral Daily  . potassium chloride  40 mEq Oral Once  . potassium chloride  40 mEq Oral Once  . sodium chloride  3 mL Intravenous Q12H   Infusions:  . amiodarone 30 mg/hr (05/12/14 0800)    Assessment: 68 yo m admitted on 9/15 for syncope.  Pt has a history of afib on coumadin at home (7.5 mg on Tuesday and Thursday, 5 mg all other days). Patient also has a ICD for vtach.  Prior history of getting shocked multiple times in the setting of lead fracture.  Patient gets his care at the [facility].  Pharmacy is consulted to dose heparin for afib. Hgb 13.4, plts 221, INR 1.62. Will give a heparin bolus of 2000 units followed by a heparin drip  at 1250 units/hr.  Will check a 8-hr HL @ 1700.  Goal of Therapy:  Heparin level 0.3-0.7 units/ml Monitor platelets by anticoagulation protocol: Yes   Plan:  Heparin bolus 2000 units Heparin drip 1250 units/hr 8-hr HL @ 1700 Monitor hgb/plts, INR, s/s of bleeding, clinical course  Cassie L. Roseanne Reno, PharmD Clinical Pharmacy Resident Pager: [phone] 05/12/2014 8:57 AM

## 2014-05-13 LAB — CBC
HCT: 41.7 % (ref 39.0–52.0)
Hemoglobin: 13.6 g/dL (ref 13.0–17.0)
MCH: 28.2 pg (ref 26.0–34.0)
MCHC: 32.6 g/dL (ref 30.0–36.0)
MCV: 86.3 fL (ref 78.0–100.0)
Platelets: 237 10*3/uL (ref 150–400)
RBC: 4.83 MIL/uL (ref 4.22–5.81)
RDW: 18.3 % — ABNORMAL HIGH (ref 11.5–15.5)
WBC: 10.7 10*3/uL — ABNORMAL HIGH (ref 4.0–10.5)

## 2014-05-13 LAB — COMPREHENSIVE METABOLIC PANEL
ALBUMIN: 2.7 g/dL — AB (ref 3.5–5.2)
ALT: 24 U/L (ref 0–53)
AST: 37 U/L (ref 0–37)
Alkaline Phosphatase: 125 U/L — ABNORMAL HIGH (ref 39–117)
Anion gap: 15 (ref 5–15)
BILIRUBIN TOTAL: 1.3 mg/dL — AB (ref 0.3–1.2)
BUN: 39 mg/dL — ABNORMAL HIGH (ref 6–23)
CO2: 37 mEq/L — ABNORMAL HIGH (ref 19–32)
CREATININE: 2.1 mg/dL — AB (ref 0.50–1.35)
Calcium: 8.9 mg/dL (ref 8.4–10.5)
Chloride: 84 mEq/L — ABNORMAL LOW (ref 96–112)
GFR calc Af Amer: 35 mL/min — ABNORMAL LOW (ref >90)
GFR calc non Af Amer: 30 mL/min — ABNORMAL LOW (ref >90)
Glucose, Bld: 179 mg/dL — ABNORMAL HIGH (ref 70–99)
Potassium: 3.4 mEq/L — ABNORMAL LOW (ref 3.7–5.3)
Sodium: 136 mEq/L — ABNORMAL LOW (ref 137–147)
Total Protein: 6.1 g/dL (ref 6.0–8.3)

## 2014-05-13 LAB — BASIC METABOLIC PANEL
ANION GAP: 11 (ref 5–15)
BUN: 42 mg/dL — ABNORMAL HIGH (ref 6–23)
CALCIUM: 8.8 mg/dL (ref 8.4–10.5)
CHLORIDE: 80 meq/L — AB (ref 96–112)
CO2: 40 mEq/L (ref 19–32)
CREATININE: 1.95 mg/dL — AB (ref 0.50–1.35)
GFR calc Af Amer: 38 mL/min — ABNORMAL LOW (ref >90)
GFR, EST NON AFRICAN AMERICAN: 33 mL/min — AB (ref >90)
Glucose, Bld: 228 mg/dL — ABNORMAL HIGH (ref 70–99)
Potassium: 3.1 mEq/L — ABNORMAL LOW (ref 3.7–5.3)
Sodium: 131 mEq/L — ABNORMAL LOW (ref 137–147)

## 2014-05-13 LAB — POCT I-STAT 3, ART BLOOD GAS (G3+)
Acid-Base Excess: 16 mmol/L — ABNORMAL HIGH (ref 0.0–2.0)
Bicarbonate: 42.8 mEq/L — ABNORMAL HIGH (ref 20.0–24.0)
O2 Saturation: 94 %
Patient temperature: 98.6
TCO2: 45 mmol/L (ref 0–100)
pCO2 arterial: 58.5 mmHg (ref 35.0–45.0)
pH, Arterial: 7.472 — ABNORMAL HIGH (ref 7.350–7.450)
pO2, Arterial: 69 mmHg — ABNORMAL LOW (ref 80.0–100.0)

## 2014-05-13 LAB — HEPARIN LEVEL (UNFRACTIONATED)
Heparin Unfractionated: 0.38 IU/mL (ref 0.30–0.70)
Heparin Unfractionated: 0.39 IU/mL (ref 0.30–0.70)

## 2014-05-13 LAB — CARBOXYHEMOGLOBIN
Carboxyhemoglobin: 1.1 % (ref 0.5–1.5)
Methemoglobin: 0.7 % (ref 0.0–1.5)
O2 SAT: 60 %
Total hemoglobin: 14.9 g/dL (ref 13.5–18.0)

## 2014-05-13 LAB — GLUCOSE, CAPILLARY
Glucose-Capillary: 173 mg/dL — ABNORMAL HIGH (ref 70–99)
Glucose-Capillary: 233 mg/dL — ABNORMAL HIGH (ref 70–99)
Glucose-Capillary: 241 mg/dL — ABNORMAL HIGH (ref 70–99)
Glucose-Capillary: 224 mg/dL — ABNORMAL HIGH (ref 70–99)

## 2014-05-13 LAB — PROTIME-INR
INR: 1.96 — ABNORMAL HIGH (ref 0.00–1.49)
Prothrombin Time: 22.3 seconds — ABNORMAL HIGH (ref 11.6–15.2)

## 2014-05-13 LAB — TSH: TSH: 6.78 u[IU]/mL — ABNORMAL HIGH (ref 0.350–4.500)

## 2014-05-13 LAB — MAGNESIUM: Magnesium: 1.8 mg/dL (ref 1.5–2.5)

## 2014-05-13 MED ORDER — EPLERENONE 25 MG PO TABS
25.0000 mg | ORAL_TABLET | Freq: Every day | ORAL | Status: DC
  Administered 2014-05-13 – 2014-05-14 (×2): 25 mg via ORAL
  Filled 2014-05-13 (×3): qty 1

## 2014-05-13 MED ORDER — POTASSIUM CHLORIDE 10 MEQ/50ML IV SOLN
10.0000 meq | INTRAVENOUS | Status: AC
  Administered 2014-05-13 (×2): 10 meq via INTRAVENOUS
  Filled 2014-05-13 (×2): qty 50

## 2014-05-13 MED ORDER — POTASSIUM CHLORIDE 10 MEQ/50ML IV SOLN
INTRAVENOUS | Status: AC
  Filled 2014-05-13: qty 50

## 2014-05-13 MED ORDER — LIDOCAINE IN D5W 4-5 MG/ML-% IV SOLN
1.0000 mg/min | INTRAVENOUS | Status: DC
  Administered 2014-05-13: 1 mg/min via INTRAVENOUS

## 2014-05-13 MED ORDER — POTASSIUM CHLORIDE 10 MEQ/50ML IV SOLN
10.0000 meq | INTRAVENOUS | Status: AC
  Administered 2014-05-13 – 2014-05-14 (×6): 10 meq via INTRAVENOUS
  Filled 2014-05-13 (×4): qty 50

## 2014-05-13 MED ORDER — WARFARIN SODIUM 5 MG PO TABS
5.0000 mg | ORAL_TABLET | Freq: Once | ORAL | Status: AC
  Administered 2014-05-13: 5 mg via ORAL
  Filled 2014-05-13: qty 1

## 2014-05-13 MED ORDER — MAGNESIUM SULFATE 40 MG/ML IJ SOLN
2.0000 g | Freq: Once | INTRAMUSCULAR | Status: AC
  Administered 2014-05-13: 2 g via INTRAVENOUS
  Filled 2014-05-13: qty 50

## 2014-05-13 MED ORDER — LIDOCAINE HCL (CARDIAC) 20 MG/ML IV SOLN: 0.7500 mg/kg | Freq: Once | INTRAVENOUS | Status: DC

## 2014-05-13 MED ORDER — LIDOCAINE BOLUS VIA INFUSION
100.0000 mg | Freq: Once | INTRAVENOUS | Status: AC
  Administered 2014-05-13: 100 mg via INTRAVENOUS
  Filled 2014-05-13: qty 100

## 2014-05-13 MED ORDER — LIDOCAINE IN D5W 4-5 MG/ML-% IV SOLN
1.0000 mg/min | INTRAVENOUS | Status: DC
  Filled 2014-05-13: qty 500

## 2014-05-13 MED ORDER — AMIODARONE LOAD VIA INFUSION
300.0000 mg | Freq: Once | INTRAVENOUS | Status: AC
  Administered 2014-05-13: 300 mg via INTRAVENOUS

## 2014-05-13 NOTE — Progress Notes (Addendum)
Advanced Heart Failure Rounding Note  Primary Physician: No PCP Per Patient  Primary Cardiologist: care at [facility]  Subjective:    68 y.o. 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 Pt reports that it was about 5 times. He doesn't recall getting shocked. Denies any prodrome or palpitations prior to the shocks. Occurred while at rest/sitting. He is essentially nonambulatory. He denies any chest pain before or after the events. No injuries from the events.   Device interrogated by medtronic shows afib burden of essentially 100%. Optivol shows fluid overload issues dating back to May of this year. Had 6 shock terminated episodes. At least one of them appears to be consistent with Kirby Funk; others suggest of rapid afib?  EF 10-15% (05/12/14)  9/15 co-ox 27% and was started on milrinone 0.125 mcg, levophed and lasix. Overnight patient more confused and ABG with CO2 58.5. Having lots of Vtach this am. + SOB. Creatinine trending up and UOP sluggish. CVP 18   Objective:   Weight Range:  Vital Signs:   Temp:  [97.3 F (36.3 C)-98.2 F (36.8 C)] 98 F (36.7 C) (09/16 0415) Pulse Rate:  [29-111] 60 (09/16 0445) Resp:  [12-22] 21 (09/16 0445) BP: (64-154)/(37-108) 117/69 mmHg (09/16 0445) SpO2:  [88 %-100 %] 97 % (09/16 0445) Weight:  [293 lb 6.9 oz (133.1 kg)] 293 lb 6.9 oz (133.1 kg) (09/16 0308) Last BM Date: 05-25-14  Weight change: Filed Weights   2014/05/25 1739 05/12/14 0145 05/13/14 0308  Weight: 295 lb (133.811 kg) 291 lb 3.6 oz (132.1 kg) 293 lb 6.9 oz (133.1 kg)    Intake/Output:   Intake/Output Summary (Last 24 hours) at 05/13/14 0739 Last data filed at 05/13/14 0639  Gross per 24 hour  Intake 2244.05 ml  Output   1216 ml  Net 1028.05 ml     Physical Exam: CVP 18 General: Chronically ill appearing. Lethargic and SOB HEENT:  normal Neck: supple. JVP to ear . Carotids 2+ bilat; no bruits. No lymphadenopathy or thryomegaly appreciated. Cor: PMI nondisplaced. Regular rate and irregular rhythym, No rubs, gallops or murmurs. Lungs: clear Abdomen: obese, soft, nontender, mildly distended. No hepatosplenomegaly. No bruits or masses. Good bowel sounds. Extremities: no cyanosis, clubbing, rash, 3+ bilateral edema Neuro: alert & orientedx3, cranial nerves grossly intact. moves all 4 extremities w/o difficulty. Affect pleasant  Telemetry: Atrial fib/flutter with multiple runs of vtach  Labs: Basic Metabolic Panel:  Recent Labs Lab May 25, 2014 1752 05-25-2014 1759  05/12/14 0200 05/12/14 0600 05/12/14 1850 05/13/14 0500  NA  --  131*  --  139 139 136* 136*  K  --  3.9  --  2.2* 2.4* 3.6* 3.4*  CL  --  81*  --  84* 84* 83* 84*  CO2  --   --   --  42* 42* 38* 37*  GLUCOSE  --  188*  --  180* 177* 136* 179*  BUN  --  49*  --  32* 34* 34* 39*  CREATININE  --  1.60*  --  1.38* 1.47* 1.83* 2.10*  CALCIUM  --   --   < > 9.0 8.8 8.9 8.9  MG 1.7  --   --  2.1  --  2.1  --   < > = values in this interval not displayed.  Liver Function Tests:  Recent Labs Lab  05/25/14 1752 05/13/14 0500  AST 34 37  ALT 23 24  ALKPHOS 135* 125*  BILITOT 1.5* 1.3*  PROT 6.4 6.1  ALBUMIN 3.0* 2.7*   No results found for this basename: LIPASE, AMYLASE,  in the last 168 hours No results found for this basename: AMMONIA,  in the last 168 hours  CBC:  Recent Labs Lab 05-25-2014 1752 25-May-2014 1759 05/13/14 0500  WBC 7.4  --  10.7*  HGB 13.4 15.6 13.6  HCT 42.4 46.0 41.7  MCV 89.3  --  86.3  PLT 221  --  237    Cardiac Enzymes:  Recent Labs Lab 25-May-2014 1752 05/12/14 0145 05/12/14 1052 05/12/14 1345  TROPONINI 0.48* <0.30 <0.30 <0.30    BNP: BNP (last 3 results)  Recent Labs  25-May-2014 1752  PROBNP 5088.0*     Other results:    Imaging: Dg Chest Port 1 View  05/12/2014   CLINICAL DATA:  Evaluate line  placement. Entire left aspect of the chest not included on the exam, but additional imaging not required by ordering physician.  EXAM: PORTABLE CHEST - 1 VIEW  COMPARISON:  2014/05/25  FINDINGS: Right IJ central venous catheter has been placed. The distal tip projects over the expected location of the junction of the superior vena cava and right atrium. Visualized portion of the heart appears stable and enlarged. Left-sided pacemaker/AICD again noted. There is right basilar atelectasis. Negative for pneumothorax on the right.  IMPRESSION: Satisfactory position of right IJ central venous catheter. Negative for pneumothorax.  Right basilar atelectasis.   Electronically Signed   By: Britta Mccreedy M.D.   On: 05/12/2014 19:14   Dg Chest Portable 1 View  05/25/14   CLINICAL DATA:  Multiple syncopal episodes.  EXAM: PORTABLE CHEST - 1 VIEW  COMPARISON:  Chest radiograph 04/21/2014  FINDINGS: Left-sided pacemaker overlies stable enlarged cardiac silhouette. Low lung volumes similar prior. Central venous congestion is present.  IMPRESSION: Mild increase in central pulmonary venous congestion. Cardiomegaly and low lung volumes similar prior.   Electronically Signed   By: Genevive Bi M.D.   On: 05/25/14 18:18      Medications:     Scheduled Medications: . antiseptic oral rinse  7 mL Mouth Rinse BID  . aspirin EC  81 mg Oral Daily  . atorvastatin  40 mg Oral q1800  . feeding supplement (PRO-STAT SUGAR FREE 64)  30 mL Oral BID PC  . furosemide  80 mg Intravenous Q12H  . gabapentin  600 mg Oral TID  . insulin aspart  0-15 Units Subcutaneous TID WC  . insulin aspart  5 Units Subcutaneous TID AC  . insulin glargine  10 Units Subcutaneous QHS  . pantoprazole  40 mg Oral Daily  . sodium chloride  3 mL Intravenous Q12H  . spironolactone  25 mg Oral Daily  . Warfarin - Pharmacist Dosing Inpatient   Does not apply q1800     Infusions: . amiodarone 30 mg/hr (05/12/14 2354)  . heparin 1,450 Units/hr  (05/13/14 0304)  . milrinone 0.125 mcg/kg/min (05/12/14 2052)  . norepinephrine (LEVOPHED) Adult infusion 18 mcg/min (05/13/14 0600)     PRN Medications:  sodium chloride, acetaminophen, albuterol, docusate sodium, ondansetron (ZOFRAN) IV, oxyCODONE, sodium chloride   Assessment:   1. Profound cardiogenic shock  2. A/c systolic HF  3. Acute respiratory failure  4. VT  5. iCM EF 10-15%  6. CAD  7. Morbid obesity  8. 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 Hypokalemia 10. Limited  Code  Plan/Discussion:     Length of Stay: 2 Aundria Rud 05/13/2014, 7:39 AM  Advanced Heart Failure Team Pager [phone] (M-F; 7a - 4p)  Please contact [facility] for night-coverage after hours (4p -7a ) and weekends on [email or web address]  Patient seen and examined with Ulla Potash, NP. We discussed all aspects of the encounter. I agree with the assessment and plan as stated above.   He is more alert this am. Co-ox improved with levophed and milrinone however has more ventricular ectopy. K being supped and was 3.4 this am. Mag ok. CVP 18. We will give more amio and continue inotropes and diuresis. I discussed with Dr. Johney Frame who agreed with [facility]. Can add lidocaine if more sustained VT.   His overall QOL has been very poor. We discussed code status. He does not wanted to be intubated, have CPR or external shocks. However, he would like to keep his ICD on for now. I will d/w his sister as well.   Supp K and mag. He refuses spiro due to previous intolerance.   The patient is critically ill with multiple organ systems failure and requires high complexity decision making for assessment and support, frequent evaluation and titration of therapies, application of advanced monitoring technologies and extensive interpretation of multiple databases.   Critical Care Time devoted to patient care services described in this note is 35 Minutes.  Daniel Bensimhon,MD 8:29 AM

## 2014-05-13 NOTE — Progress Notes (Signed)
CRITICAL VALUE ALERT  Critical value received: CO2 in BMET 40  Date of notification:  05/13/14  Time of notification:  1930  Critical value read back:Yes.    Nurse who received alert:  [place] Perkins  MD notified (1st page):  2015  Time of first page:  2015  Night fellow made aware of critical high

## 2014-05-13 NOTE — Progress Notes (Signed)
Patient ID: Justin Davis, male   DOB: May 22, 1946, 68 y.o.   MRN: [id number]   Patient Name: Justin Davis Date of Encounter: 05/13/2014     Active Problems:   V-tach   Cardiogenic shock    SUBJECTIVE  "I feel better"  CURRENT MEDS . antiseptic oral rinse  7 mL Mouth Rinse BID  . aspirin EC  81 mg Oral Daily  . atorvastatin  40 mg Oral q1800  . eplerenone  25 mg Oral Daily  . feeding supplement (PRO-STAT SUGAR FREE 64)  30 mL Oral BID PC  . furosemide  80 mg Intravenous Q12H  . gabapentin  600 mg Oral TID  . insulin aspart  0-15 Units Subcutaneous TID WC  . insulin aspart  5 Units Subcutaneous TID AC  . insulin glargine  10 Units Subcutaneous QHS  . lidocaine (cardiac) 100 mg/69ml  0.75 mg/kg Intravenous Once  . magnesium sulfate 1 - 4 g bolus IVPB  2 g Intravenous Once  . pantoprazole  40 mg Oral Daily  . potassium chloride  10 mEq Intravenous Q1 Hr x 6  . potassium chloride      . sodium chloride  3 mL Intravenous Q12H  . Warfarin - Pharmacist Dosing Inpatient   Does not apply q1800    OBJECTIVE  Filed Vitals:   05/13/14 1500 05/13/14 1600 05/13/14 1627 05/13/14 1700  BP: 110/69  Pulse: 69 57 74 53  Temp:   97.9 F (36.6 C)   TempSrc:   Axillary   Resp: Height:      Weight:      SpO2: 93% 96% 97% 97%    Intake/Output Summary (Last 24 hours) at 05/13/14 2015 Last data filed at 05/13/14 1838  Gross per 24 hour  Intake 2018.2 ml  Output   1156 ml  Net  862.2 ml   Filed Weights   05/05/2014 1739 05/12/14 0145 05/13/14 0308  Weight: 295 lb (133.811 kg) 291 lb 3.6 oz (132.1 kg) 293 lb 6.9 oz (133.1 kg)    PHYSICAL EXAM  General: chronically ill 68 yo man NAD. Neuro: Alert and oriented X 3. Moves all extremities spontaneously. HEENT:  Normal  Neck: Supple without bruits, 8cm JVD. Lungs:  Resp regular and unlabored, CTA. Heart: IRIRR distant Abdomen: Soft, non-tender, non-distended, BS + x 4.  Extremities: No clubbing, cyanosis  or edema. DP/PT/Radials trace bilaterally.  Accessory Clinical Findings  CBC  Recent Labs  05/21/2014 1752 05/17/2014 1759 05/13/14 0500  WBC 7.4  --  10.7*  HGB 13.4 15.6 13.6  HCT 42.4 46.0 41.7  MCV 89.3  --  86.3  PLT 221  --  237   Basic Metabolic Panel  Recent Labs  05/12/14 1850 05/13/14 0500 05/13/14 1840  NA 136* 136* 131*  K 3.6* 3.4* 3.1*  CL 83* 84* 80*  CO2 38* 37* 40*  GLUCOSE 136* 179* 228*  BUN 34* 39* 42*  CREATININE 1.83* 2.10* 1.95*  CALCIUM 8.9 8.9 8.8  MG 2.1  --  1.8   Liver Function Tests  Recent Labs  05/03/2014 1752 05/13/14 0500  AST 34 37  ALT 23 24  ALKPHOS 135* 125*  BILITOT 1.5* 1.3*  PROT 6.4 6.1  ALBUMIN 3.0* 2.7*   No results found for this basename: LIPASE, AMYLASE,  in the last 72 hours Cardiac Enzymes  Recent Labs  05/12/14 0145 05/12/14 1052 05/12/14 1345  TROPONINI <0.30 <0.30 <0.30   BNP  No components found with this basename: POCBNP,  D-Dimer No results found for this basename: DDIMER,  in the last 72 hours Hemoglobin A1C No results found for this basename: HGBA1C,  in the last 72 hours Fasting Lipid Panel No results found for this basename: CHOL, HDL, LDLCALC, TRIG, CHOLHDL, LDLDIRECT,  in the last 72 hours Thyroid Function Tests  Recent Labs  05/13/14 0400  TSH 6.780*    TELE  Atrial fib with frequent and dense PVC's, non-sustained polymorphic VT  ECG Atrial fib with PMVT, non-sustained  Radiology/Studies  Dg Chest Port 1 View  05/12/2014   CLINICAL DATA:  Evaluate line placement. Entire left aspect of the chest not included on the exam, but additional imaging not required by ordering physician.  EXAM: PORTABLE CHEST - 1 VIEW  COMPARISON:  05/26/2014  FINDINGS: Right IJ central venous catheter has been placed. The distal tip projects over the expected location of the junction of the superior vena cava and right atrium. Visualized portion of the heart appears stable and enlarged. Left-sided  pacemaker/AICD again noted. There is right basilar atelectasis. Negative for pneumothorax on the right.  IMPRESSION: Satisfactory position of right IJ central venous catheter. Negative for pneumothorax.  Right basilar atelectasis.   Electronically Signed   By: Britta Mccreedy M.D.   On: 05/12/2014 19:14   Dg Chest Portable 1 View  05/12/2014   CLINICAL DATA:  Multiple syncopal episodes.  EXAM: PORTABLE CHEST - 1 VIEW  COMPARISON:  Chest radiograph 04/21/2014  FINDINGS: Left-sided pacemaker overlies stable enlarged cardiac silhouette. Low lung volumes similar prior. Central venous congestion is present.  IMPRESSION: Mild increase in central pulmonary venous congestion. Cardiomegaly and low lung volumes similar prior.   Electronically Signed   By: Genevive Bi M.D.   On: 05/21/2014 18:18    ASSESSMENT AND PLAN 1. VT storm 2. Cardiogenic shock 3. Ischemic cardiomyopathy Rec: he remains critically ill and prognosis is guarded. Will try lidocaine, replete potassium and magnesium and wean off of pressors as blood pressure tolerates.   Gregg Taylor,M.D.  Gregg Taylor,M.D.  05/13/2014 8:15 PM

## 2014-05-13 NOTE — Progress Notes (Signed)
Pharmacist Heart Failure Core Measure Documentation  Assessment: Justin Davis has an EF documented as 10-15% on 05/12/14 by Echo.  Rationale: Heart failure patients with left ventricular systolic dysfunction (LVSD) and an EF < 40% should be prescribed an angiotensin converting enzyme inhibitor (ACEI) or angiotensin receptor blocker (ARB) at discharge unless a contraindication is documented in the medical record.  This patient is not currently on an ACEI or ARB for HF.  This note is being placed in the record in order to provide documentation that a contraindication to the use of these agents is present for this encounter.  ACE Inhibitor or Angiotensin Receptor Blocker is contraindicated (specify all that apply)  []   ACEI allergy AND ARB allergy []   Angioedema []   Moderate or severe aortic stenosis []   Hyperkalemia [x]   Hypotension []   Renal artery stenosis [x]   Worsening renal function, preexisting renal disease or dysfunction   Harland German, Pharm D 05/13/2014 1:28 PM

## 2014-05-13 NOTE — Progress Notes (Signed)
Pt not tolerated CPAP mask. Would prefer to wear his own mask. Wants CPAP off at this time. Educated pt on importance of wearing it. Will continue to monitor.

## 2014-05-13 NOTE — Progress Notes (Signed)
RN asked me to place pt on cpap d/t pt sleeping, and slight increased WOB noted.  Pt tol well.  No resp distress noted presently.  VSS

## 2014-05-13 NOTE — Progress Notes (Signed)
Patient ID: Justin Davis, male   DOB: Jul 13, 1946, 68 y.o.   MRN: [id number]      Code Status: Full  Allergies  Allergen Reactions  . Carvedilol Diarrhea  . Ciprofloxacin Other (See Comments)    RESPIRATORY DISTRESS  . Clindamycin/Lincomycin Swelling and Other (See Comments)    CHEST PAIN  . Spironolactone Other (See Comments)    Stopped pores in breast    Chief Complaint: Acute visit follow up  HPI:  Following up with Justin Davis since last week. Pt was having increased SOB, leg swelling, fatigue/malaise, and weight gain. Pts weight is down today to 290.4, 8lbs since last Thursday. Currently patient is up in wheelchair and washing. Pt states he is feeling better. Swelling has improved, shortness of breath is back to baseline without exacerbation. Pt is able to participate in ADLs without fatigue. Pt denies chest pain or difficulty breathing.   Review of Systems:  Constitutional: Negative for fever, chills, malaise/fatigue and diaphoresis.  Respiratory: chronically short of breath, improved since last visit.  Negative for cough, sputum production, and wheezing.   Cardiovascular: Leg swelling improved. Negative for chest pain, palpitations, and orthopnea    Genitourinary: Negative for dysuria, urgency, frequency, hematuria and flank pain.  .     Past Medical History  Diagnosis Date  . Diabetes   . Swelling     FEET AND LEGS  . Arthritis   . HBP (high blood pressure)   . Bruises easily   . CHF (congestive heart failure)   . CAD (coronary artery disease)   . Chronic kidney disease   . Hyperlipidemia   . A-fib   . Morbid obesity   . Obstructive sleep apnea   . Chronic venous stasis dermatitis of both lower extremities   . Thrombocytopenia   . Left nephrolithiasis    Past Surgical History  Procedure Laterality Date  . Cardiac surgery    . Knee surgery Bilateral   . Cardiac defibrillator placement     Social History:   reports that he quit smoking about 30 years ago. His  smoking use included Cigarettes. He smoked 0.00 packs per day. He has never used smokeless tobacco. He reports that he does not drink alcohol or use illicit drugs.  History reviewed. No pertinent family history.  Medications:   Medication List       This list is accurate as of: 05/05/14 11:22 AM.  Always use your most recent med list.               albuterol 108 (90 BASE) MCG/ACT inhaler  Commonly known as:  PROVENTIL HFA;VENTOLIN HFA  Inhale 2 puffs into the lungs 2 (two) times daily.     amiodarone 200 MG tablet  Commonly known as:  PACERONE  Take 200 mg by mouth daily.     aspirin 81 MG tablet  Take 81 mg by mouth daily.     docusate sodium 100 MG capsule  Commonly known as:  COLACE  Take 100 mg by mouth 2 (two) times daily as needed for mild constipation.     flunisolide 29 MCG/ACT (0.025%) nasal spray  Commonly known as:  NASAREL  Place 2 sprays into the nose 2 (two) times daily. Dose is for each nostril.     furosemide 40 MG tablet  Commonly known as:  LASIX  Take 1 tablet (40 mg total) by mouth daily.     gabapentin 400 MG capsule  Commonly known as:  NEURONTIN  Take 600  mg by mouth 3 (three) times daily.     glipiZIDE 2.5 MG 24 hr tablet  Commonly known as:  GLUCOTROL XL  Take 2.5 mg by mouth daily with breakfast.     insulin aspart 100 UNIT/ML injection  Commonly known as:  novoLOG  Inject 5 Units into the skin 3 (three) times daily before meals. For cbg >=150     insulin glargine 100 UNIT/ML injection  Commonly known as:  LANTUS  Inject 0.1 mLs (10 Units total) into the skin at bedtime.     loratadine 10 MG tablet  Commonly known as:  CLARITIN  Take 10 mg by mouth daily.     nitroGLYCERIN 0.4 MG SL tablet  Commonly known as:  NITROSTAT  Place 0.4 mg under the tongue every 5 (five) minutes as needed for chest pain.     omeprazole 20 MG capsule  Commonly known as:  PRILOSEC  Take 20 mg by mouth daily.     oxyCODONE 5 MG immediate release tablet    Commonly known as:  Oxy IR/ROXICODONE  Take 1 tablet (5 mg total) by mouth every 6 (six) hours as needed for severe pain.     rosuvastatin 10 MG tablet  Commonly known as:  CRESTOR  Take 10 mg by mouth daily.     warfarin 5 MG tablet  Commonly known as:  COUMADIN  Take 3 mg by mouth daily at 6 PM.          Physical Exam:  Filed Vitals:   05/05/14 1117  BP: 108/78  Pulse: 78  Temp: 96.5 F (35.8 C)  Resp: 20  Weight: 290 lb 6.4 oz (131.725 kg)  SpO2: 92%    General- elderly male in no acute distress Nose- normal nasal mucosa, no maxillary or frontal sinus tenderness Chest- no chest wall deformities, no chest wall tenderness Cardiovascular- normal s1,s2, no murmurs/ rubs/ gallops; 1-2+ pitting edema noted to bilateral legs Respiratory- bilateral clear to auscultation, no wheeze, no rhonchi, no crackles, no use of accessory muscles Abdomen- bowel sounds present, soft, non tender, no organomegaly, no abdominal bruits, no guarding or rigidity, no CVA tenderness Skin- warm and dry; bilateral compression wraps to legs.  Psychiatry- alert and oriented to person, place and time, normal mood and affect    Labs reviewed: SIGNIFICANT DIAGNOSTIC EXAMS  04-21-14: chest x-ray: worsening aeration compared with priors. AICD device grossly satisfactory position. Marked right hemidiaphragm elevation. Mild vascular congestion, worse than priors.   04-21-14: renal ultrasound: no hydronephrosis. Slightly increased echogenicity of the right kidney; left nephrolithiasis.   04-22-14: 2-d echo: ef 25-30%; moderate to severe left ventricle systolic dysfunction  04-30-14; chest x-ray, no acute findings; cardiomegaly without CHF findings; bibasilar subsegmental atelectatic changes    LABS REVIEWED:   04-22-14: wbc 7.8; hgb 12.9; hct 40.5; mcv 92 plt 39; glucose 96; bun 41; creat 2.14; k+3.9; na++139 mag 2.3 tsh 2.72; hgb a1c 7.7 04-23-14: glucose 115; bun 43; creat 2.16; k+4.2; na++  132 04-28-14: wbc 7.0; hgb 13.3; hct 44.1 mcv 94.4; plt 177; glucose 226; bun 43; creat 1.5; k+4.4; na++133; liver normal albumin 3.4  04-30-14: wbc 6.3; hgb 12.4; hct 40.8; mcv 91.1; plt 247; BNP 2300; na 132; bun 39; creat 1.6; gfr 46.96  Assessment/Plan 1. Follow up CHF exacerbation: Pt is improving; weight down Continue Lasix as scheduled Chest x-ray fairly normal; BNP elevated 2300 Continue to monitor  Keatts, Jacques Earthly, Student-NP

## 2014-05-13 NOTE — Progress Notes (Signed)
Patient patient seen and examined Events overnight noted. Patient surprisingly stable but has confirmed that he is DO NOT RESUSCITATE and only wishes pressors and does not wish aggressive ACLS protocols including intubation mechanical ventilation or other issues as per Dr. Gala Romney. His blood sugars are stable between 120 to 200 range and hence we will sign off I discussed this with Dr. Royetta Crochet, MD [facility] (P) [phone]

## 2014-05-13 NOTE — Progress Notes (Signed)
ANTICOAGULATION CONSULT NOTE - Follow Up Consult  Pharmacy Consult for Heparin  Indication: atrial fibrillation  Allergies  Allergen Reactions  . Ciprofloxacin Other (See Comments)    RESPIRATORY DISTRESS  . Clindamycin/Lincomycin Swelling and Other (See Comments)    CHEST PAIN  . Carvedilol Diarrhea  . Spironolactone Other (See Comments)    Stopped pores in breast    Patient Measurements: Height: 5\' 7"  (170.2 cm) Weight: 291 lb 3.6 oz (132.1 kg) IBW/kg (Calculated) : 66.1 Heparin Dosing Weight: 97 kg  Vital Signs: Temp: 97.5 F (36.4 C) (09/15 2300) Temp src: Axillary (09/15 2300) BP: 99/77 mmHg (09/16 0000) Pulse Rate: 70 (09/16 0000)  Labs:  Recent Labs  05/03/2014 1752  05/13/2014 1759 05/12/14 0019 05/12/14 0145 05/12/14 0200 05/12/14 0600 05/12/14 1052 05/12/14 1345 05/12/14 1655 05/12/14 1850 05/12/14 2358  HGB 13.4  --  15.6  --   --   --   --   --   --   --   --   --   HCT 42.4  --  46.0  --   --   --   --   --   --   --   --   --   PLT 221  --   --   --   --   --   --   --   --   --   --   --   LABPROT  --   --   --  19.2*  --   --   --   --   --   --   --   --   INR  --   --   --  1.62*  --   --   --   --   --   --   --   --   HEPARINUNFRC  --   --   --   --   --   --   --   --   --  0.25*  --  0.38  CREATININE  --   < > 1.60*  --   --  1.38* 1.47*  --   --   --  1.83*  --   TROPONINI 0.48*  --   --   --  <0.30  --   --  <0.30 <0.30  --   --   --   < > = values in this interval not displayed.  Estimated Creatinine Clearance: 49.1 ml/min (by C-G formula based on Cr of 1.83).   Assessment: Therapeutic heparin level x 1, on heparin while INR is <2, other labs as above.   Goal of Therapy:  Heparin level 0.3-0.7 units/ml Monitor platelets by anticoagulation protocol: Yes   Plan: -Continue heparin at 1450 units/hr -AM HL -Daily CBC/HL -Monitor for bleeding -F/U heparin DC when INR >2  Justin Davis 05/13/2014,12:35 AM

## 2014-05-13 NOTE — Progress Notes (Signed)
ANTICOAGULATION CONSULT NOTE - Follow Up Consult  Pharmacy Consult for Heparin/ Coumadin Indication: atrial fibrillation  Allergies  Allergen Reactions  . Ciprofloxacin Other (See Comments)    RESPIRATORY DISTRESS  . Clindamycin/Lincomycin Swelling and Other (See Comments)    CHEST PAIN  . Carvedilol Diarrhea  . Spironolactone Other (See Comments)    Stopped pores in breast    Patient Measurements: Height: 5\' 7"  (170.2 cm) Weight: 293 lb 6.9 oz (133.1 kg) IBW/kg (Calculated) : 66.1 Heparin Dosing Weight: 97 kg  Vital Signs: Temp: 98 F (36.7 C) (09/16 0415) Temp src: Oral (09/16 0415) BP: 113/66 mmHg (09/16 0745) Pulse Rate: 52 (09/16 0745)  Labs:  Recent Labs  05-26-2014 1752 26-May-2014 1759 05/12/14 0019 05/12/14 0145  05/12/14 0600 05/12/14 1052 05/12/14 1345 05/12/14 1655 05/12/14 1850 05/12/14 2358 05/13/14 0500 05/13/14 0600  HGB 13.4 15.6  --   --   --   --   --   --   --   --   --  13.6  --   HCT 42.4 46.0  --   --   --   --   --   --   --   --   --  41.7  --   PLT 221  --   --   --   --   --   --   --   --   --   --  237  --   LABPROT  --   --  19.2*  --   --   --   --   --   --   --   --   --  22.3*  INR  --   --  1.62*  --   --   --   --   --   --   --   --   --  1.96*  HEPARINUNFRC  --   --   --   --   --   --   --   --  0.25*  --  0.38  --  0.39  CREATININE  --  1.60*  --   --   < > 1.47*  --   --   --  1.83*  --  2.10*  --   TROPONINI 0.48*  --   --  <0.30  --   --  <0.30 <0.30  --   --   --   --   --   < > = values in this interval not displayed.  Estimated Creatinine Clearance: 43 ml/min (by C-G formula based on Cr of 2.1).  Marland Kitchen amiodarone 30 mg/hr (05/12/14 2354)  . heparin 1,450 Units/hr (05/13/14 0304)  . milrinone 0.125 mcg/kg/min (05/12/14 2052)  . norepinephrine (LEVOPHED) Adult infusion 15 mcg/min (05/13/14 0745)     Assessment: 68 yo male on chronic Coumadin for afib with INR slightly subtherapeutic although approaching 2.  Heparin  level therapeutic on 1450 units/hr.  No bleeding or complications noted.  PTA Coumadin dose 5 mg daily except 7.5 mg on Tues and Thurs.  Goal of Therapy:  Heparin level 0.3-0.7 units/ml Monitor platelets by anticoagulation protocol: Yes INR 2-3   Plan: - Continue heparin at 1450 units/hr - Coumadin 5 mg po x 1 tonight. - Daily CBC/HL/INR - Monitor for bleeding - F/U heparin DC when INR >2  Tad Moore, BCPS  Clinical Pharmacist Pager [phone]  05/13/2014 8:01 AM

## 2014-05-14 DIAGNOSIS — Z515 Encounter for palliative care: Secondary | ICD-10-CM

## 2014-05-14 DIAGNOSIS — R0609 Other forms of dyspnea: Secondary | ICD-10-CM

## 2014-05-14 DIAGNOSIS — R0600 Dyspnea, unspecified: Secondary | ICD-10-CM

## 2014-05-14 DIAGNOSIS — I5043 Acute on chronic combined systolic (congestive) and diastolic (congestive) heart failure: Secondary | ICD-10-CM

## 2014-05-14 DIAGNOSIS — R0989 Other specified symptoms and signs involving the circulatory and respiratory systems: Secondary | ICD-10-CM

## 2014-05-14 LAB — BASIC METABOLIC PANEL
Anion gap: 11 (ref 5–15)
BUN: 43 mg/dL — ABNORMAL HIGH (ref 6–23)
CO2: 40 mEq/L (ref 19–32)
Calcium: 8.8 mg/dL (ref 8.4–10.5)
Chloride: 81 mEq/L — ABNORMAL LOW (ref 96–112)
Creatinine, Ser: 1.92 mg/dL — ABNORMAL HIGH (ref 0.50–1.35)
GFR calc Af Amer: 39 mL/min — ABNORMAL LOW (ref >90)
GFR calc non Af Amer: 34 mL/min — ABNORMAL LOW (ref >90)
Glucose, Bld: 236 mg/dL — ABNORMAL HIGH (ref 70–99)
Potassium: 3.7 mEq/L (ref 3.7–5.3)
Sodium: 132 mEq/L — ABNORMAL LOW (ref 137–147)

## 2014-05-14 LAB — CBC
HCT: 39.2 % (ref 39.0–52.0)
Hemoglobin: 12.6 g/dL — ABNORMAL LOW (ref 13.0–17.0)
MCH: 27.5 pg (ref 26.0–34.0)
MCHC: 32.1 g/dL (ref 30.0–36.0)
MCV: 85.4 fL (ref 78.0–100.0)
Platelets: 201 10*3/uL (ref 150–400)
RBC: 4.59 MIL/uL (ref 4.22–5.81)
RDW: 18.3 % — ABNORMAL HIGH (ref 11.5–15.5)
WBC: 9.3 10*3/uL (ref 4.0–10.5)

## 2014-05-14 LAB — GLUCOSE, CAPILLARY
Glucose-Capillary: 224 mg/dL — ABNORMAL HIGH (ref 70–99)
Glucose-Capillary: 222 mg/dL — ABNORMAL HIGH (ref 70–99)
Glucose-Capillary: 214 mg/dL — ABNORMAL HIGH (ref 70–99)

## 2014-05-14 LAB — CARBOXYHEMOGLOBIN
Carboxyhemoglobin: 1 % (ref 0.5–1.5)
METHEMOGLOBIN: 0.6 % (ref 0.0–1.5)
O2 Saturation: 51.9 %
Total hemoglobin: 12.9 g/dL — ABNORMAL LOW (ref 13.5–18.0)

## 2014-05-14 LAB — PROTIME-INR
INR: 2.18 — ABNORMAL HIGH (ref 0.00–1.49)
Prothrombin Time: 24.3 seconds — ABNORMAL HIGH (ref 11.6–15.2)

## 2014-05-14 LAB — T4, FREE: Free T4: 1.14 ng/dL (ref 0.80–1.80)

## 2014-05-14 LAB — HEPARIN LEVEL (UNFRACTIONATED): Heparin Unfractionated: 0.38 IU/mL (ref 0.30–0.70)

## 2014-05-14 MED ORDER — SODIUM CHLORIDE 0.9 % IV SOLN
10.0000 mg/h | INTRAVENOUS | Status: DC
  Administered 2014-05-14: 15 mg/h via INTRAVENOUS
  Administered 2014-05-14: 10 mg/h via INTRAVENOUS
  Administered 2014-05-15: 15 mg/h via INTRAVENOUS
  Filled 2014-05-14 (×3): qty 10

## 2014-05-14 MED ORDER — MIDAZOLAM HCL 2 MG/2ML IJ SOLN: 2.0000 mg | INTRAMUSCULAR | Status: DC | PRN

## 2014-05-14 MED ORDER — POTASSIUM CHLORIDE CRYS ER 20 MEQ PO TBCR: 20.0000 meq | EXTENDED_RELEASE_TABLET | Freq: Once | ORAL | Status: DC

## 2014-05-14 MED ORDER — LORAZEPAM 2 MG/ML IJ SOLN
1.0000 mg | INTRAMUSCULAR | Status: DC | PRN
  Administered 2014-05-14: 1 mg via INTRAVENOUS
  Filled 2014-05-14: qty 1

## 2014-05-14 MED ORDER — MORPHINE SULFATE 2 MG/ML IJ SOLN: 1.0000 mg | INTRAMUSCULAR | Status: DC | PRN

## 2014-05-14 MED ORDER — MIDAZOLAM HCL 2 MG/2ML IJ SOLN
4.0000 mg | INTRAMUSCULAR | Status: DC | PRN
  Administered 2014-05-14: 4 mg via INTRAVENOUS
  Filled 2014-05-14: qty 4

## 2014-05-14 NOTE — Progress Notes (Signed)
Advanced Heart Failure Rounding Note  Primary Physician: No PCP Per Patient  Primary Cardiologist: care at [facility]  Subjective:    68 y.o. 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 Pt reports that it was about 5 times. He doesn't recall getting shocked. Denies any prodrome or palpitations prior to the shocks. Occurred while at rest/sitting. He is essentially nonambulatory. He denies any chest pain before or after the events. No injuries from the events.   Device interrogated by medtronic shows afib burden of essentially 100%. Optivol shows fluid overload issues dating back to May of this year. Had 6 shock terminated episodes. At least one of them appears to be consistent with Kirby Funk; others suggest of rapid afib?  EF 10-15% (05/12/14)  9/15 co-ox 27% and was started on milrinone 0.125 mcg, levophed and lasix. Continued with persistent VT last night and was started on lidocaine gtt. Remains on amiodarone, milrinone and levophed. Co-ox 52%. Received 2 gm Mag last night. Having palliative meeting this am. Weight up 8 lbs and 24 hr I/O +913 cc.   Objective:   Weight Range:  Vital Signs:   Temp:  [97.7 F (36.5 C)-98.4 F (36.9 C)] 97.7 F (36.5 C) (09/17 0730) Pulse Rate:  [36-101] 74 (09/17 0700) Resp:  [14-24] 17 (09/17 0700) BP: (73-162)/(37-128) 87/59 mmHg (09/17 0700) SpO2:  [69 %-100 %] 98 % (09/17 0700) Weight:  [301 lb 2.4 oz (136.6 kg)] 301 lb 2.4 oz (136.6 kg) (09/17 0413) Last BM Date: 05/05/2014  Weight change: Filed Weights   05/12/14 0145 05/13/14 0308 05/14/14 0413  Weight: 291 lb 3.6 oz (132.1 kg) 293 lb 6.9 oz (133.1 kg) 301 lb 2.4 oz (136.6 kg)    Intake/Output:   Intake/Output Summary (Last 24 hours) at 05/14/14 0754 Last data filed at 05/14/14 0600  Gross per 24 hour  Intake 2323.42 ml  Output   1410 ml  Net  913.42 ml     Physical Exam: CVP 15 General: Chronically ill appearing. Lethargic and SOB HEENT: normal Neck: supple. JVP to ear . Carotids 2+ bilat; no bruits. No lymphadenopathy or thryomegaly appreciated. Cor: PMI nondisplaced. Regular rate and irregular rhythym, No rubs, gallops or murmurs. Lungs: clear Abdomen: obese, soft, nontender, mildly distended. No hepatosplenomegaly. No bruits or masses. Good bowel sounds. Extremities: no cyanosis, clubbing, rash, 3+ bilateral edema Neuro: alert & orientedx3, cranial nerves grossly intact. moves all 4 extremities w/o difficulty. Affect pleasant  Telemetry: Atrial fib/flutter with multiple runs of vtach  Labs: Basic Metabolic Panel:  Recent Labs Lab 05/05/2014 1752  05/12/14 0200 05/12/14 0600 05/12/14 1850 05/13/14 0500 05/13/14 1840 05/14/14 0359  NA  --   < > 139 139 136* 136* 131* 132*  K  --   < > 2.2* 2.4* 3.6* 3.4* 3.1* 3.7  CL  --   < > 84* 84* 83* 84* 80* 81*  CO2  --   < > 42* 42* 38* 37* 40* 40*  GLUCOSE  --   < > 180* 177* 136* 179* 228* 236*  BUN  --   < > 32* 34* 34* 39* 42* 43*  CREATININE  --   < > 1.38* 1.47* 1.83* 2.10* 1.95* 1.92*  CALCIUM  --   < > 9.0 8.8 8.9 8.9 8.8 8.8  MG 1.7  --  2.1  --  2.1  --  1.8  --   < > = values in this interval not displayed.  Liver Function Tests:  Recent Labs Lab 05/22/2014 1752 05/13/14 0500  AST 34 37  ALT 23 24  ALKPHOS 135* 125*  BILITOT 1.5* 1.3*  PROT 6.4 6.1  ALBUMIN 3.0* 2.7*   No results found for this basename: LIPASE, AMYLASE,  in the last 168 hours No results found for this basename: AMMONIA,  in the last 168 hours  CBC:  Recent Labs Lab 05/27/2014 1752 05/14/2014 1759 05/13/14 0500 05/14/14 0359  WBC 7.4  --  10.7* 9.3  HGB 13.4 15.6 13.6 12.6*  HCT 42.4 46.0 41.7 39.2  MCV 89.3  --  86.3 85.4  PLT 221  --  237 201    Cardiac Enzymes:  Recent Labs Lab 05/12/2014 1752 05/12/14 0145 05/12/14 1052 05/12/14 1345  TROPONINI 0.48* <0.30 <0.30  <0.30    BNP: BNP (last 3 results)  Recent Labs  04/28/2014 1752  PROBNP 5088.0*     Other results:    Imaging: Dg Chest Port 1 View  05/12/2014   CLINICAL DATA:  Evaluate line placement. Entire left aspect of the chest not included on the exam, but additional imaging not required by ordering physician.  EXAM: PORTABLE CHEST - 1 VIEW  COMPARISON:  04/29/2014  FINDINGS: Right IJ central venous catheter has been placed. The distal tip projects over the expected location of the junction of the superior vena cava and right atrium. Visualized portion of the heart appears stable and enlarged. Left-sided pacemaker/AICD again noted. There is right basilar atelectasis. Negative for pneumothorax on the right.  IMPRESSION: Satisfactory position of right IJ central venous catheter. Negative for pneumothorax.  Right basilar atelectasis.   Electronically Signed   By: Britta Mccreedy M.D.   On: 05/12/2014 19:14     Medications:     Scheduled Medications: . antiseptic oral rinse  7 mL Mouth Rinse BID  . aspirin EC  81 mg Oral Daily  . atorvastatin  40 mg Oral q1800  . eplerenone  25 mg Oral Daily  . feeding supplement (PRO-STAT SUGAR FREE 64)  30 mL Oral BID PC  . furosemide  80 mg Intravenous Q12H  . gabapentin  600 mg Oral TID  . insulin aspart  0-15 Units Subcutaneous TID WC  . insulin aspart  5 Units Subcutaneous TID AC  . insulin glargine  10 Units Subcutaneous QHS  . pantoprazole  40 mg Oral Daily  . sodium chloride  3 mL Intravenous Q12H  . Warfarin - Pharmacist Dosing Inpatient   Does not apply q1800    Infusions: . amiodarone 30 mg/hr (05/14/14 0356)  . heparin 1,450 Units/hr (05/13/14 1836)  . lidocaine 1 mg/min (05/13/14 2200)  . milrinone 0.125 mcg/kg/min (05/13/14 1134)  . norepinephrine (LEVOPHED) Adult infusion 14 mcg/min (05/14/14 0600)    PRN Medications: sodium chloride, acetaminophen, albuterol, docusate sodium, ondansetron (ZOFRAN) IV, oxyCODONE, sodium  chloride   Assessment:   1. Profound cardiogenic shock  2. A/c systolic HF  3. Acute respiratory failure  4. VT  5. iCM EF 10-15%  6. CAD  7. Morbid obesity  8. 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 Hypokalemia 10. Limited Code 11. Hypomagnesemia   Plan/Discussion:    Very difficult situation, patient is suffering from end stage HF and cardiogenic shock. His co-ox remains low on multiple pressors and renal function is trending up. Will not increase milrinone with continued Vtach and hypotension. He is markedly volume overloaded  will continue lasix currently.   He continues to have frequent runs of Vtach and is now on lidocaine and amiodarone. His magnesium and K+ are being supplemented and will continue to follow.   Patient has already elected to be limited code and does not want to be intubated, have CPR or have external defibrillation. His ICD remains on and plan is for family to meet with palliative care this am. Would favor moving towards more of a comfort care approach. His CO2 remains high and he is not tolerating Bipap for very long. If he does not move towards comfort care would have family bring his Bipap mask in that he is more comfortable wearing.   Length of Stay: 3 Aundria Rud 05/14/2014, 7:54 AM  Advanced Heart Failure Team Pager [phone] (M-F; 7a - 4p)  Please contact [facility] for night-coverage after hours (4p -7a ) and weekends on [email or web address]  Patient seen and examined with Ulla Potash, NP. We discussed all aspects of the encounter. I agree with the assessment and plan as stated above.   Started on lidocaine last night with decreased ectopy but continues to do poorly. He is on double pressors and two anti-arrhythmic agents. His baseline functional status is very poor. He has clearly reached end-stage. Long talk about options and at this point he favors comfort care but wants to talk to his sisters first. We have called a Palliative care consult. Continue current  regimen for now.   The patient is critically ill with multiple organ systems failure and requires high complexity decision making for assessment and support, frequent evaluation and titration of therapies, application of advanced monitoring technologies and extensive interpretation of multiple databases.   Critical Care Time devoted to patient care services described in this note is 35 Minutes.  Kolbey Teichert,MD 11:49 AM

## 2014-05-14 NOTE — Consult Note (Signed)
Patient Justin Davis      DOB: 20-Apr-1946      LZJ:[id number]     Consult Note from the Palliative Medicine Team at [place] Requested by: Junie Bame, NP     PCP: No PCP Per Patient Reason for Consultation: GOC and options     Phone Number:None  Assessment of patients Current state: I met today with Mr. Chisolm, his daughter and her husband, granddaughter, grandson, 2 sisters and family friend. He clearly states that he discussed with Dr. Haroldine Laws to take him off these "high powered drugs" and he tells his family "I'm ready to go." He has had a long road and is not even able to talk in a whole sentence due to being short of breath. He tells [place] he is tired of suffering. He wishes to deactivate his AICD and then proceed to d/c his IV infusions. His only concern is that he not be in pain or suffer and also concerned about his sisters as they have always taken care of each other and live together. We discussed that we will provide him with adequate medication to ease any pain or dyspnea that may occur. He and his family understand and agree with this plan. We discussed that after these changes are made he may live minutes to hours but likely not longer.   He and his family discuss funeral arrangements and his requests and he tells them his wishes for his belongings (home/car/etc). I have contacted cardiology to deactivate AICD and I have also paged Dr. Haroldine Laws per patient/family request (to see them ~1330). I will follow and support.    Goals of Care: 1.  Code Status: DNR   2. Scope of Treatment: 1. Vital Signs: daily 2. AICD: deactivate 3. Respiratory/Oxygen: for comfort 4. Nutritional Support/Tube Feeds: no 5. Review of Medications to be discontinued: minimize to comfort 6. Labs: no   4. Disposition: Anticipate hospital death.    3. Symptom Management:   1. Anxiety/seizure: Lorazepam 1 mg every 4 hours prn.  2. Pain/dyspnea: Morphine 1-2 mg IV every hour prn. He may  require infusion when vasoactive infusions d/c.   4. Psychosocial: Emotional support provided to patient and family during difficult conversation.   5. Spiritual: Offered chaplain support - patient denied.    Brief HPI: 68 yo male admitted with syncope and found to have had numerous fires from his AICD but he was unaware of this. 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 PMH significant for CAD, atrial fibrillation, ICD, VT, diabetes, hypertension, morbid obesity, obstructive sleep apnea, arthritis, CKD stage III, chronic venous stasis dermatitis of BLE.    ROS: + shortness of breath. Denies pain, anxiety.     PMH:  Past Medical History  Diagnosis Date  . Diabetes   . Arthritis   . HBP (high blood pressure)   . CHF (congestive heart failure)   . CAD (coronary artery disease)   . Chronic kidney disease   . Hyperlipidemia   . A-fib   . Morbid obesity   . Obstructive sleep apnea   . Chronic venous stasis dermatitis of both lower extremities   . Thrombocytopenia   . Left nephrolithiasis   . Ventricular tachycardia      PSH: Past Surgical History  Procedure Laterality Date  . Knee surgery Bilateral   . Cardiac defibrillator placement  2005    MDT  ICD implanted 2005 [facility]; RV lead revision 2007 at [facility]; gen change 09/2012 MDT dual chamber ICD at [facility]   I have reviewed the [facility] and [facility] and  If appropriate update it with new information. Allergies  Allergen Reactions  . Ciprofloxacin Other (See Comments)    RESPIRATORY DISTRESS  . Clindamycin/Lincomycin Swelling and Other (See Comments)    CHEST PAIN  . Carvedilol Diarrhea  . Spironolactone Other (See Comments)    Stopped pores in breast   Scheduled Meds: . antiseptic oral rinse  7 mL Mouth Rinse BID  . aspirin EC  81 mg Oral Daily  . atorvastatin  40 mg Oral q1800  . eplerenone  25 mg Oral Daily  .  feeding supplement (PRO-STAT SUGAR FREE 64)  30 mL Oral BID PC  . furosemide  80 mg Intravenous Q12H  . gabapentin  600 mg Oral TID  . insulin aspart  0-15 Units Subcutaneous TID WC  . insulin aspart  5 Units Subcutaneous TID AC  . insulin glargine  10 Units Subcutaneous QHS  . pantoprazole  40 mg Oral Daily  . sodium chloride  3 mL Intravenous Q12H  . Warfarin - Pharmacist Dosing Inpatient   Does not apply q1800   Continuous Infusions: . amiodarone 30 mg/hr (05/14/14 0356)  . lidocaine 1 mg/min (05/13/14 2200)  . milrinone 0.125 mcg/kg/min (05/14/14 0846)  . norepinephrine (LEVOPHED) Adult infusion 15 mcg/min (05/14/14 0819)   PRN Meds:.sodium chloride, acetaminophen, albuterol, docusate sodium, ondansetron (ZOFRAN) IV, oxyCODONE, sodium chloride    BP 99/52  Pulse 74  Temp(Src) 98.1 F (36.7 C) (Oral)  Resp 15  Ht 5' 7"  (1.702 m)  Wt 136.6 kg (301 lb 2.4 oz)  BMI 47.16 kg/m2  SpO2 96%   PPS: 30%   Intake/Output Summary (Last 24 hours) at 05/14/14 1203 Last data filed at 05/14/14 1200  Gross per 24 hour  Intake 2093.13 ml  Output   1425 ml  Net 668.13 ml   LBM: 05/24/2014                         Physical Exam:  General: NAD, ill appearing, pleasant, cooperative HEENT:  [place]/AT, +JVD, moist mucous membranes Chest: No labored breathing, symmetric CVS: Irregular rhythm on monitor Abdomen: Soft, NT, ND Ext: MAE, BLE 3+ edema, multiple dressings CDI BLE Neuro: Awake, alert, oriented x 3, follows commands  Labs: CBC    Component Value Date/Time   WBC 9.3 05/14/2014 0359   RBC 4.59 05/14/2014 0359   HGB 12.6* 05/14/2014 0359   HCT 39.2 05/14/2014 0359   PLT 201 05/14/2014 0359   MCV 85.4 05/14/2014 0359   MCH 27.5 05/14/2014 0359   MCHC 32.1 05/14/2014 0359   RDW 18.3* 05/14/2014 0359    BMET    Component Value Date/Time   NA 132* 05/14/2014 0359   K 3.7 05/14/2014 0359   CL 81* 05/14/2014 0359   CO2 40* 05/14/2014 0359   GLUCOSE 236* 05/14/2014 0359   BUN 43*  05/14/2014 0359   CREATININE 1.92* 05/14/2014 0359   CALCIUM 8.8 05/14/2014 0359   GFRNONAA 34* 05/14/2014 0359   GFRAA 39* 05/14/2014 0359    CMP     Component Value Date/Time   NA 132* 05/14/2014 0359   K 3.7 05/14/2014 0359   CL 81* 05/14/2014 0359   CO2 40* 05/14/2014 0359   GLUCOSE 236* 05/14/2014 0359   BUN 43* 05/14/2014 0359   CREATININE 1.92*  05/14/2014 0359   CALCIUM 8.8 05/14/2014 0359   PROT 6.1 05/13/2014 0500   ALBUMIN 2.7* 05/13/2014 0500   AST 37 05/13/2014 0500   ALT 24 05/13/2014 0500   ALKPHOS 125* 05/13/2014 0500   BILITOT 1.3* 05/13/2014 0500   GFRNONAA 34* 05/14/2014 0359   GFRAA 39* 05/14/2014 0359     Time In Time Out Total Time Spent with Patient Total Overall Time  1130 1215 16mn 421m    Greater than 50%  of this time was spent counseling and coordinating care related to the above assessment and plan.  AlVinie SillNP Palliative Medicine Team Pager # [phone]-F 8a-5p) Team Phone # [phone]/Weekends)

## 2014-05-14 NOTE — Progress Notes (Signed)
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 Pt reports that it was about 5 times.     Action/Plan:   return to alf vs snf/all outside care is through [place] in [place]   Anticipated DC Date:  05-29-14   Anticipated DC Plan:  SKILLED NURSING FACILITY  In-house referral  Clinical Social Worker  Hospice / Palliative Care      DC Planning Services  CM consult      [facility] Choice  NA   Choice offered to / List presented to:  NA   DME arranged  NA      DME agency  NA     HH arranged  NA      HH agency  NA   Status of service:  In process, will continue to follow Medicare Important Message given?   (If response is "NO", the following Medicare IM given date fields will be blank) Date Medicare IM given:   Medicare IM given by:   Date Additional Medicare IM given:   Additional Medicare IM given by:    Discharge Disposition:    Per UR Regulation:  Reviewed for med. necessity/level of care/duration of stay  If discussed at Long Length of Stay Meetings, dates discussed:    Comments:  [id number]/Rhonda Davis,RN,BSN,CCM: palliative care meeting pending for today/end stage HF and cardiogenic shock/co-ox remains low on multiple pressors and renal function is trending up. no increase in the milrinone with continued Vtach and hypotension/ markedly volume overloaded will continue lasix currently/ will follow to see next steps per patient after palliative care meeting.   He continues to have frequent runs of Vtach and is now on lidocaine and amiodarone. His magnesium and K+ are being supplemented and will continue to follow.    [id number]  Davis,RN,BSN,CCM: patient significant cardiac history/alf versus snf/open vascular wounds to the lower legs/states he has been mainly bed bound due to the wounds/will follow for any needs or transfer to [facility].

## 2014-05-14 NOTE — Progress Notes (Signed)
  Discussed situation with patient and his family.   He wants to be comfort care. We will deactivate ICD. Start versed and morphine gtts. Wean off inotropes.   Suspect he will pass today.  Daniel Bensimhon,MD 1:35 PM

## 2014-05-14 NOTE — Progress Notes (Addendum)
Full note to follow:  I have met with Justin Davis and much of his family (daughter, grandchildren, sisters). He clearly states and tells me and them that he is "ready to go." Agrees to deactivate AICD and d/c vasoactive infusions. I have called for ICD to be deactivated. Notified heart failure team of plan. Dr. Haroldine Laws to see soon. Proceed with d/c infusions after ICD and full comfort measures per patient request with family support.   Vinie Sill, NP Palliative Medicine Team Pager # [phone] (M-F 8a-5p) Team Phone # [phone] (Nights/Weekends)

## 2014-05-15 DIAGNOSIS — R570 Cardiogenic shock: Secondary | ICD-10-CM

## 2014-05-17 NOTE — Progress Notes (Signed)
Patient ID: Justin Davis, male   DOB: 09-10-1945, 68 y.o.   MRN: [id number]    Facility: [facility]   Chief Complaint  Patient presents with  . New Admit To SNF   Allergies  Allergen Reactions  . Ciprofloxacin Other (See Comments)    RESPIRATORY DISTRESS  . Clindamycin/Lincomycin Swelling and Other (See Comments)    CHEST PAIN  . Carvedilol Diarrhea  . Spironolactone Other (See Comments)    Stopped pores in breast   HPI 68 y.o. male patient is here for STR after hospital admission with chf exacerbation, bilateral lower extremity cellulitis and acute on chronic renal failure. He has increased edema upto thigh today. He also has venous stasis ulcer to both legs and is getting wound care. 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  Respiratory: Negative for cough and shortness of breath.   Cardiovascular: Negative for chest pain and palpitations. Has leg swelling Gastrointestinal: Negative for heartburn, abdominal pain and constipation.  Musculoskeletal: Negative for joint pain.  has chronic pain Skin:  has venous stasis ulcers Psychiatric/Behavioral: Negative for depression. The patient is not nervous/anxious.   Past Medical History  Diagnosis Date  . Diabetes   . Arthritis   . HBP (high blood pressure)   . CHF (congestive heart failure)   . CAD (coronary artery disease)   . Chronic kidney disease   . Hyperlipidemia   . A-fib   . Morbid obesity   . Obstructive sleep apnea   . Chronic venous stasis dermatitis of both lower extremities   . Thrombocytopenia   . Left nephrolithiasis   . Ventricular tachycardia    Past Surgical History  Procedure Laterality Date  . Knee surgery Bilateral   . Cardiac defibrillator placement  2005    MDT ICD implanted 2005 [facility]; RV lead revision 2007 at [facility];  gen change 09/2012 MDT dual chamber ICD at [facility]   Medication reviewed. See MAR  History   Social History  . Marital Status: Divorced    Spouse Name: N/A    Number of Children: N/A  . Years of Education: N/A   Occupational History  . Not on file.   Social History Main Topics  . Smoking status: Former Smoker    Types: Cigarettes    Quit date: 06/03/1983  . Smokeless tobacco: Never Used  . Alcohol Use: No     Comment: STOPPED DRINKING ALCOHOL 2004  . Drug Use: No  . Sexual Activity: No   Other Topics Concern  . Not on file   Social History Narrative  . No narrative on file   Family History  Problem Relation Age of Onset  . Diabetes Mother   . Diabetes Sister   . Cancer Father     lung     Physical Exam  BP 130/89  Pulse 70  Temp(Src) 98 F (36.7 C)  Resp 16  SpO2 96%  Constitutional: He is oriented to person, place, and time. He appears well-developed and well-nourished. No distress. Morbidly obese   Neck: Neck supple. No JVD present.  Cardiovascular: irregular rate, murmur present Respiratory: poor air entry. Effort normal and breath sounds normal. No respiratory distress.  GI: Soft. Bowel sounds are normal. He exhibits no distension. There is no tenderness.  Musculoskeletal: pitting edema upto thigh area and also in lower lumbar  region, weakness of both LE, dressing in both legs- venous ulcer in left medical calf, right medial calf, right third toe Neurological: He is alert and oriented to person, place, and time.  Skin: Skin is warm and dry.  Psychiatric: He has a normal mood and affect.   Labs 04-22-14: wbc 7.8; hgb 12.9; hct 40.5; mcv 92 plt 39; glucose 96; bun 41; creat 2.14; k+3.9; na++139 mag 2.3 tsh 2.72; hgb a1c 7.7 04-23-14: glucose 115; bun 43; creat 2.16; k+4.2; na++ 132  Imaging 04-21-14: chest x-ray: worsening aeration compared with priors. AICD device grossly satisfactory position. Marked right hemidiaphragm elevation. Mild vascular  congestion, worse than priors.   04-21-14: renal ultrasound: no hydronephrosis. Slightly increased echogenicity of the right kidney; left nephrolithiasis.   04-22-14: 2-d echo: ef 25-30%; moderate to severe left ventricle systolic dysfunction   ASSESSMENT/ PLAN:  Leg edema From venous staisis and chf. Increase lasix to 60 mg bid x 5 days and then to 60 mg daily. check bmp in a week  CHF lantus dosing changed, see above. continue daily weights and 1200 cc fluid restriction. Has AICD in place  Venous stasis ulcer Continue wound care, dressing change and prn pain meds  Afib Rate controlled, on amiodarone and aspirin with coumadin  Dm  Monitor cbg. Continue lantus 10 u and premeal novolog with glipizide  Constipation Add senna s bid and prn miralax  Dyslipidemia continue crestor 10 mg daily   CAD Remains chest pain free. continue asa 81 mg daily and prn NTG.   Peripheral neuropathy continue neurontin 600 mg tid  Genella Rife continue prilosec 20 mg daily

## 2014-05-28 NOTE — Progress Notes (Addendum)
Pt time of death 06-11-2014 at 0618.  Two nurses assessed and auscultated, no breath or heart sounds for 2 minutes.  Pt asystole on the monitor.  Pt daughter notified.  Attending physician aware.

## 2014-05-28 NOTE — Progress Notes (Signed)
Post mortum care completed patient to morgue.

## 2014-05-28 NOTE — Progress Notes (Signed)
32mL morphine gtt wasted down sink.  Witnessed by Gregor Hams RN

## 2014-05-28 NOTE — Consult Note (Signed)
I have reviewed this case with our NP and agree with the Assessment and Plan as stated.  Concepcion Kirkpatrick L. Alvetta Hidrogo, MD MBA The Palliative Medicine Team at [place] Team Phone: [phone] Pager: [phone]   

## 2014-05-28 NOTE — Discharge Summary (Signed)
Advanced Heart Failure Team  DEATH SUMMARY   Patient ID: Justin Davis MRN: [id number], DOB/AGE: 1945/08/30 68 y.o. 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 male w/ PMHx significant for CAD, ischemic CMP with EF 10-15%, atrial fib, ICD for Vtach, who presented to [facility] on 05/12/2014 from his care facility where he had "seizures" and passing out.   On admission Device interrogated by [employer] shows afib burden of essentially 100%. Optivol shows fluid overload issues dating back to May of this year. Had 6 shock terminated episodes. At least one of them appears to be consistent with Tanna Furry; others suggest of rapid afib?Marland Kitchen   He was started on IV amiodarone and for his VT, however continued to have persistent Vtach. A central line was placed in order to obtain CVPs and Co-ox. Initial co-ox was 27% confirming cardiogenic shock and he was started on levophed and milrinone. He continued to have persistent VT and lidocaine was added. The patient decided after discussion with the MD about his prognosis that he wanted to be comfortable and did not want to continue aggressive measures. A Palliative Care Consult was placed and they met with the patient and family and the patient clearly stated he was ready to be comfortable and have everything turned off so his ICD was deactivated and once a morphine gtt was started his vasopressors were weaned off.   The patient remained comfortable with no signs of distress and passed peacefully at 0618 am (2014-05-20). Two nurses assessed and auscultated for 2 minutes and did not hear any breath sounds or heart sounds and he was asystole on the monitor. The family was notified  as well as MD.   Labs: Lab Results  Component Value Date   WBC 9.3 05/14/2014   HGB 12.6* 05/14/2014   HCT 39.2 05/14/2014   MCV 85.4 05/14/2014   PLT 201 05/14/2014    Recent Labs Lab 05/13/14 0500  05/14/14 0359  NA 136*  < > 132*  K 3.4*  < > 3.7  CL 84*  < > 81*  CO2 37*  < > 40*  BUN 39*  < > 43*  CREATININE 2.10*  < > 1.92*  CALCIUM 8.9  < > 8.8  PROT 6.1  --   --   BILITOT 1.3*  --   --   ALKPHOS 125*  --   --   ALT 24  --   --   AST 37  --   --   GLUCOSE 179*  < > 236*  < > = values in this interval not displayed. No results found for this basename: CHOL, HDL, LDLCALC, TRIG   BNP (last 3 results)  Recent Labs  05/13/2014 1752  PROBNP 5088.0*    Diagnostic Studies/Procedures   No results found.   Duration of Discharge Encounter: Greater than 35 minutes   Signed, Rande Brunt NP-C 20-May-2014, 7:40 AM  Patient seen and examined with Junie Bame, NP. We discussed all aspects of the encounter. I agree with the Death Summary above.   Benay Spice 3:51 PM

## 2014-05-28 DEATH — deceased

## 2014-12-19 NOTE — Consult Note (Signed)
Present Illness Patient is a 69 year old male with history of dilated cardiomyopathy, Coronary artery disease, arrhythmia was followed at the [facility].  He presented to our emergency room with complaints of inability to walk or move his legs.  He also states it was difficult for him to move his arms.  He denies chest pain.  He complains chronic shortness of breath.  States he has not been able walk for several years.  He spends majority of the time in the wheelchair.  Chest x-ray revealed cardiomegaly with no high-grade heart failure.  He is on amiodarone as well as Coumadin.  His ICD is being followed at the during the Medical [facility].  He denies any recent firing of the device.  He has felt somewhat better since admission however still has inability move use legs.  He has chronic cellulitis of both lower extremities.  He  has mild-to-moderate renal insufficiency with serum creatinine of greater than 2. He had borderline troponin elevation at 0.14.  This appears to be Diovan.  He stateshe has  been compliant with his medications.   Physical Exam:  GEN obese, disheveled   HEENT PERRL   NECK supple   RESP no use of accessory muscles  rhonchi  crackles   CARD Regular rate and rhythm  Murmur   Murmur Systolic   Systolic Murmur axilla   ABD denies tenderness  no Adominal Mass   LYMPH negative neck, negative axillae   EXTR negative cyanosis/clubbing, positive edema   SKIN positive rashes, positive ulcers, skin turgor poor   NEURO cranial nerves intact, motor/sensory function intact   PSYCH alert, poor insight   Review of Systems:  Subjective/Chief Complaint Bilateral lower extremity weakness and fatigue   General: Fatigue  Weakness   Skin: Rashes  Dryness  Color changes   ENT: No Complaints   Eyes: No Complaints   Neck: No Complaints   Respiratory: Short of breath   Cardiovascular: Dyspnea   Gastrointestinal: No Complaints   Genitourinary: No Complaints    Vascular: No Complaints   Musculoskeletal: Muscle or joint pain  the bilateral lower extremity weakness   Neurologic: No Complaints   Hematologic: No Complaints   Endocrine: No Complaints   Psychiatric: No Complaints   Review of Systems: All other systems were reviewed and found to be negative   Medications/Allergies Reviewed Medications/Allergies reviewed   EKG:  Interpretation ventricular paced rhythm with probable underlying atrial fibrillation.    Isosorbide Mononitrate: N/V, Headaches  Spironolactone: Swelling  Flunisolide: Other  Percodan: Unknown   Impression 69 year old with history of probable ischemic cardiomyopathy Coronary disease diabetes AICD in place who has a severely reduced LV function by echocardiogram with ejection fraction around 20%.  He was admitted due to inability move his legs.  He does have cardiomegaly on chest x-ray but no high-grade heart failure.  He appears to be chronic systolic failure [place] heart Association class 3-4.  He he appears  drove ruled out for myocardial infarction as his serum troponin is trivially elevated in the face of renal insufficiency and chronic heart failure.  Would continue with  amiodarone and  warfarin with a goal INR of 2-3.  Would gently hydrate.  Will need physical therapy to evaluate his functional status and to assist with plans for discharge.  Would not proceed with invasive evaluation at this point given the patient's underlying comorbid condition. would agree with holding spironolactone in the face of a   Elevated serum potassium.  Will  need to consider resuming t this at discharge   Plan 1. Continue with current medications including amiodarone, warfarin,  and diuresis.  Would continue to hold spironolactone as serum potassium is corrected 2. physical therapy to evaluate transfer and ambulatory ability to guide disposition and discharge 3. Low-sodium diet 4. Will follow on telemetry 5. Further recommendations  based on course   Electronic Signatures: Dalia Heading (MD)  (Signed 26-Aug-15 13:54)  Authored: General Aspect/Present Illness, History and Physical Exam, Review of System, EKG , Allergies, Impression/Plan   Last Updated: 26-Aug-15 13:54 by Dalia Heading (MD)

## 2014-12-19 NOTE — Discharge Summary (Signed)
Dates of Admission and Diagnosis:  Date of Admission 21-Apr-2014   Date of Discharge 23-Apr-2014   Admitting Diagnosis generalized weakness.   Final Diagnosis severe generalized weakness- multifactorial- morbid obasity, CRF, CHF,r ecurrent hospital admissions in past few months. CHF- s/p AICD- EF <30% Ac on CRF DM Htn coagulopathy- on coumadin INR 4 in hospital- decreased dose.    Chief Complaint/History of Present Illness A 69 year old Caucasian male with a history of CHF, diabetes and CAD who presented to the ED due to generalized weakness. The patient is alert, awake and oriented, in no acute distress. The patient has been hospitalized recently and treated with an antibiotic for bilateral leg cellulitis. He was discharged from the [facility] recently, but he has had generalized weakness for a few days, unable to get to the wheelchair. The patient denies any fever or chills. No chest pain, palpitations, orthopnea, or nocturnal dyspnea. The patient has leg edema with ulcers. The patient denies any other symptoms. According to Dr. Thomasene Lot, since the patient has generalized weakness the patient will be admitted to the hospital then the patient needs subacute rehab placement.   Allergies:  Isosorbide Mononitrate: N/V, Headaches  Spironolactone: Swelling  Flunisolide: Other  Percodan: Unknown  Hepatic:  25-Aug-15 13:02   Bilirubin, Total  1.5  Alkaline Phosphatase 115 (46-116 NOTE: New Reference Range 03/17/14)  SGPT (ALT) 38 (14-63 NOTE: New Reference Range 03/17/14)  SGOT (AST)  46  Total Protein, Serum 6.9  Albumin, Serum  3.0  General Ref:  27-Aug-15 04:16   ANA w/Reflex to 9 Conf.Tests ========== TEST NAME ==========  ========= RESULTS =========  = REFERENCE RANGE =  ANA W/REFLEX-9 CONF.TEST  ANA w/Reflex if Positive ANA Direct                      [   Negative             ]          Negative               [facility]            No: [id number]           [address], [place], [place] [address]           Lindon Romp, MD         [phone]   Result(s) reported on 24 Apr 2014 at 12:48PM.  ANCA Panel ========== TEST NAME ==========  ========= RESULTS =========  = REFERENCE RANGE =  ANCA PANEL  ANCA Panel Antimyeloperoxidase (MPO) Abs   [   <9.0 U/mL            ]           0.0-9.0 Antiproteinase 3 (PR-3) Abs     [   <3.5 U/mL            ]       0.0-3.5 Cytoplasmic (C-ANCA)            [   <1:20 titer          ]         Neg:<1:20 Perinuclear (P-ANCA)            [   <1:20 titer          ]         Neg:<1:20 The presence of positive fluorescence exhibiting P-ANCA or C-ANCA patterns alone is not specific for the diagnosis of Wegener's Granulomatosis (  WG) or microscopic polyangiitis. Decisions about treatment should not be based solely on ANCA IFA results.  The International ANCA Group Consensus recommends follow up testing of positive sera with both PR-3 and MPO-ANCA enzyme immunoassays. As many as 5% serum samples are positive only by EIA. Ref. AM J Clin Pathol 1999;111:507-513. Atypical pANCA                  [   <1:20 titer          ]         Neg:<1:20 The atypical pANCA pattern has been observed in a significant percentage of patients with ulcerative colitis, primary sclerosing cholangitis and autoimmune hepatitis.               [facility]            No: [id number]           [address], [place], [place] [address]           Lindon Romp, MD         [phone]   Result(s) reported on 24 Apr 2014 at 05:54PM.  Parathyroid Hormone, Intact ========== TEST NAME ==========  ========= RESULTS =========  = REFERENCE RANGE =  PTH INTACT  PTH, Intact PTH, Intact                     [H  101 pg/mL            ]             [address]               [facility]            No: [id number]           [address], [place], [place] [address]           Lindon Romp, MD         [phone]   Result(s) reported  on 24 Apr 2014 at 05:54PM.  Protein Electrophoresis, Serum ========== TEST NAME ==========  ========= RESULTS =========  = REFERENCE RANGE =  PROTEIN ELECTROPHORESIS   Cardiology:  26-Aug-15 07:36   Echo Doppler REASON FOR EXAM:     COMMENTS:     PROCEDURE: [facility] - ECHO DOPPLER COMPLETE(TRANSTHOR)  - Apr 22 2014  7:36AM   RESULT: Echocardiogram Report  Patient Name:   Justin Davis Date of Exam: 04/22/2014 Medical Rec #:  [id number]      Custom1: Date of Birth:  1946-06-16   Height:       64.0 in Patient Age:    86 years    Weight:       281.0 lb Patient Gender: M           BSA:          2.26 m??  Indications: CHF Sonographer:    Sherrie Sport RDCS Referring Phys: Demetrios Loll  Sonographer Comments: Technically difficult study due to poor echo  windows and The best images are from the parasternal view.  Summary:  1. Left ventricular ejection fraction, by visual estimation, is 25 to  30%.  2. Moderately to severely decreased global left ventricular systolic  function.  3. Moderately dilated left atrium.  4. Moderately dilated right atrium.  5. Mild to moderate mitral valve regurgitation.  6. Mildly elevated pulmonary artery systolic pressure.  7. Mild to moderate tricuspid regurgitation. 2D AND M-MODE MEASUREMENTS (normal ranges within parentheses): Left Ventricle:  Normal IVSd (2D):      1.24 cm (0.7-1.1) LVPWd (2D):     1.31 cm (0.7-1.1) Aorta/LA:                  Normal LVIDd (2D):     5.11 cm (3.4-5.7) Aortic Root(2D): 3.40 cm (2.4-3.7) LVIDs (2D):     4.55 cm           Left Atrium (2D): 6.10 cm (1.9-4.0) LV FS (2D):     11.0 %   (>25%) LV EF (2D):     23.7 %   (>50%)                                   Right Ventricle:                                   RVd (2D):        3.76 cm LV DIASTOLIC FUNCTION: MV Peak E: 0.53 m/s E/e' Ratio: 7.00                     Decel Time: 120 msec SPECTRAL DOPPLER ANALYSIS (where applicable): Mitral Valve: MV P1/2 Time: 34.80 msec MV Area,  PHT: 6.32 cm?? Aortic Valve: AoV Max Vel: 0.73 m/s AoV Peak PG: 2.1 mmHg AoV Mean PG: LVOT Vmax: 0.40 m/s LVOT VTI:  LVOT Diameter: 2.20 cm AoV Area, Vmax: 2.10 cm?? AoV Area, VTI:  AoV Area, Vmn: Tricuspid Valve and PA/RV Systolic Pressure: TR Max Velocity: 2.97 m/s RA  Pressure: 5 mmHg RVSP/PASP: 40.4 mmHg Pulmonic Valve: PV Max Velocity: 0.95 m/s PV Max PG: 3.6 mmHg PV Mean PG:  PHYSICIAN INTERPRETATION: Left Ventricle: The left ventricular internal cavity size was normal. LV  septal wall thickness was normal. LV posterior wall thickness was normal.  Global LV systolic function was moderately to severely decreased. Left  ventricular ejection fraction, by visual estimation, is 25 to 30%. Right Ventricle: The right ventricular size is mildly enlarged. Left Atrium: The left atrium is moderately dilated. Right Atrium: The right atrium is moderately dilated. Mitral Valve: The mitral valve is not well seen. Mild to moderate mitral  valve regurgitation is seen. Tricuspid Valve: Mild to moderate tricuspid regurgitation is visualized.  The tricuspid regurgitant velocity is 2.97 m/s, and with an assumed right  atrial pressure of 5 mmHg, the estimated right ventricular systolic  pressure is mildly elevated at 40.4 mmHg. Aortic Valve: The aortic valve was not well seen. Additional Comments: A pacer wire is visualized.  [place] MD Electronically signed by [id number] Bill MD Signature Date/Time: 04/22/2014/11:58:27 AM *** Final ***  IMPRESSION: .    Verified By: Teodoro Spray, M.D., MD  Routine Chem:  25-Aug-15 13:02   Glucose, Serum  178  BUN  40  Creatinine (comp)  2.26  Sodium, Serum  131  Potassium, Serum  5.8  Chloride, Serum  90  CO2, Serum 30  Calcium (Total), Serum 9.6  Anion Gap 11  Osmolality (calc) 277  eGFR (African American)  33  eGFR (Non-African American)  28 (eGFR values <18m/min/1.73 m2 may be an indication of chronic kidney disease  (CKD). Calculated eGFR is useful in patients with stable renal function. The eGFR calculation will not be reliable in acutely ill patients when serum creatinine is changing rapidly. It is not useful in  patients  on dialysis. The eGFR calculation may not be applicable to patients at the low and high extremes of body sizes, pregnant women, and vegetarians.)  Result Comment TROPONIN - RESULTS VERIFIED BY REPEAT TESTING.   - READ-BACK PROCESS PERFORMED.  - LAUREN SKELLY 04/21/14 @ 1419.Marland KitchenMarland KitchenMTV  Result(s) reported on 21 Apr 2014 at 02:26PM.  27-Aug-15 04:16   Glucose, Serum  115  BUN  43  Creatinine (comp)  2.16  Sodium, Serum  132  Potassium, Serum 4.2  Chloride, Serum  94  CO2, Serum 29  Calcium (Total), Serum 9.2  Anion Gap 9  Osmolality (calc) 276  eGFR (African American)  35  eGFR (Non-African American)  30 (eGFR values <69m/min/1.73 m2 may be an indication of chronic kidney disease (CKD). Calculated eGFR is useful in patients with stable renal function. The eGFR calculation will not be reliable in acutely ill patients when serum creatinine is changing rapidly. It is not useful in  patients on dialysis. The eGFR calculation may not be applicable to patients at the low and high extremes of body sizes, pregnant women, and vegetarians.)  Result Comment PT/INR - RESULTS VERIFIED BY REPEAT TESTING.  - NOTIFIED OF CRITICAL VALUE  - NOTIFIED MARGARET JAMES AT [phone]  - 04/23/14..Marland KitchenMarland KitchenStrasburg - READ-BACK PROCESS PERFORMED.  Result(s) reported on 23 Apr 2014 at 05:23AM.  Cardiac:  25-Aug-15 13:02   Troponin I  0.12 (0.00-0.05 0.05 ng/mL or less: NEGATIVE  Repeat testing in 3-6 hrs  if clinically indicated. >0.05 ng/mL: POTENTIAL  MYOCARDIAL INJURY. Repeat  testing in 3-6 hrs if  clinically indicated. NOTE: An increase or decrease  of 30% or more on serial  testing suggests a  clinically important change)  Routine Coag:  25-Aug-15 13:02   Prothrombin  29.1  INR 2.8 (INR reference  interval applies to patients on anticoagulant therapy. A single INR therapeutic range for coumarins is not optimal for all indications; however, the suggested range for most indications is 2.0 - 3.0. Exceptions to the INR Reference Range may include: Prosthetic heart valves, acute myocardial infarction, prevention of myocardial infarction, and combinations of aspirin and anticoagulant. The need for a higher or lower target INR must be assessed individually. Reference: The Pharmacology and Management of the Vitamin K  antagonists: the seventh ACCP Conference on Antithrombotic and Thrombolytic Therapy. CPXTGG.2694Sept:126 (3suppl): 2N9146842 A HCT value >55% may artifactually increase the PT.  In one study,  the increase was an average of 25%. Reference:  "Effect on Routine and Special Coagulation Testing Values of Citrate Anticoagulant Adjustment in Patients with High HCT Values." American Journal of Clinical Pathology 2006;126:400-405.)  27-Aug-15 04:16   Prothrombin  38.2  INR  4.1 (INR reference interval applies to patients on anticoagulant therapy. A single INR therapeutic range for coumarins is not optimal for all indications; however, the suggested range for most indications is 2.0 - 3.0. Exceptions to the INR Reference Range may include: Prosthetic heart valves, acute myocardial infarction, prevention of myocardial infarction, and combinations of aspirin and anticoagulant. The need for a higher or lower target INR must be assessed individually. Reference: The Pharmacology and Management of the Vitamin K  antagonists: the seventh ACCP Conference on Antithrombotic and Thrombolytic Therapy. CWNIOE.7035Sept:126 (3suppl): 2N9146842 A HCT value >55% may artifactually increase the PT.  In one study,  the increase was an average of 25%. Reference:  "Effect on Routine and Special Coagulation Testing Values of Citrate Anticoagulant Adjustment in Patients with High HCT Values." American  Journal of Clinical  Pathology 6203;559:741-638.)  Routine Hem:  25-Aug-15 13:02   WBC (CBC) 8.6  RBC (CBC) 4.81  Hemoglobin (CBC) 14.0  Hematocrit (CBC) 44.6  Platelet Count (CBC)  38  MCV 93  MCH 29.1  MCHC  31.5  RDW  18.2  Neutrophil % 76.0  Lymphocyte % 12.6  Monocyte % 10.2  Eosinophil % 0.6  Basophil % 0.6  Neutrophil # 6.5  Lymphocyte # 1.1  Monocyte # 0.9  Eosinophil # 0.0  Basophil # 0.1 (Result(s) reported on 21 Apr 2014 at 01:28PM.)   PERTINENT RADIOLOGY STUDIES: XRay:    25-Aug-15 13:17, Chest Portable Single View  Chest Portable Single View   REASON FOR EXAM:    weakness, 89% RA  COMMENTS:       PROCEDURE: DXR - DXR PORTABLE CHEST SINGLE VIEW  - Apr 21 2014  1:17PM     CLINICAL DATA:  Weakness and hypoxia.    EXAM:  PORTABLE CHEST - 1 VIEW    COMPARISON:  06/10/2010.    FINDINGS:  Cardiacenlargement. Dual lead pacer is poorly visualized, probable  AICD. Marked RIGHT hemidiaphragm elevation. Mild vascular  congestion. Thoracic atherosclerosis.     IMPRESSION:  Worsening aeration compared with priors. AICD device grossly  satisfactory position. Marked RIGHT hemidiaphragm elevation and  vascular congestion, worse from priors.      Electronically Signed    By: Rolla Flatten M.D.    On: 04/21/2014 13:27         Verified By: Staci Righter, M.D.,  [place]:    25-Aug-15 17:12, US Kidney Bilateral  US Kidney Bilateral   REASON FOR EXAM:    arf  COMMENTS:       PROCEDURE: [place]  - US KIDNEY  - Apr 21 2014  5:12PM     CLINICAL DATA:  Acute renal failure.    EXAM:  RENAL/URINARY TRACT ULTRASOUND COMPLETE    COMPARISON:  None.    FINDINGS:  Right Kidney:  Length: 10.4 cm..Slightly increased echogenicity. Exophytic cyst  projects posteriorly from the mid pole of the kidney measuring 2.2 x  1.9 x 2.0 cm.    Left Kidney:    Length: 9.8 cm. Multiple echogenic foci within the kidney cast  acoustic shadows consistent with stones. The largest is in  the lower  pole measuring 6 mm in length. Parenchymal echogenicity within  normal limits. No renal mass. No hydronephrosis..    Bladder:    Appears normal for degree of bladder distention.   IMPRESSION:  No hydronephrosis. Slightly increased echogenicity of the RIGHT  kidney. LEFT nephrolithiasis.      Electronically Signed    By: Rolla Flatten M.D.    On: 04/21/2014 17:17         Verified By: Staci Righter, M.D.,  LabUnknown:    25-Aug-15 13:17, Chest Portable Single View  PACS Image     25-Aug-15 17:12, US Kidney Bilateral  PACS Image    Pertinent Past History:  Pertinent Past History CHF, diabetes, CAD status post AICD, bilateral leg cellulitis.   Hospital Course:  Hospital Course 1.  Acute on ch renal failure. we dont have baseline- but will cont hydration and follow up.   nephrology following. supportive care. may be cronic.   stopped IV fluids as he has low EF.   may follow with nephrology as out pt. 2.  Elevated troponin, possibly due to acute renal failure. and also had baseline CHF.   Appreciated cardiology help.  pt is already on  statin, amiodarone, warfarin, nitro.   held warfarin due to high INR. 3.  Malignant hypertension. - on admission- now stable. hold lisinopril and lasix, hydralazine PRN. 4.  Hyperkalemia. - corrected now. after kayexalate dose. 5.  Hyponatremia. IV fluids. 6.  Bilateral leg ulcers. wound care consult- recent admission for cellulitis and treated with Abx. 7.  Thrombocytopenia. - appears to be stable- will monitor- no active bleed. 8.  History of congestive heart failure. - appreciated cardiology help- no acute decompensation. 9.  Coronary artery disease.- cont home meds. 10.  Diabetes.- ISS.  11. generalized weakness- multifactorila- severe obasity, with CHF, renal failure- and sickness with cellulitis of legs recently.   Condition on Discharge Stable   Code Status:  Code Status No Code/Do Not Resuscitate   DISCHARGE  INSTRUCTIONS HOME MEDS:  Medication Reconciliation: Patient's Home Medications at Discharge:     Medication Instructions  amiodarone 200 mg oral tablet  1  orally once a day    gabapentin 300 mg oral tablet  2  orally 3 times a day    acuprin 81 oral tablet  1  orally once a day    nitroglycerin 0.4 mg sublingual tablet  1 ea sublingual  PRN     albuterol aerosol with adapter 90 mcg/inh  2 puff(s) inhaled 2 times a day    allegra-d 12 hour tablet, extended release 60 mg-120 mg  1 tab(s) orally 4 times a day PRN    colace capsule sodium 100 mg  1 cap(s) orally 2 times a day PRN    triamcinolone topical cream 0.1%  1  applied topically 3 times a day PRN     rosuvastatin tablet 10 mg  1 tab(s) orally once a day (at bedtime)    loratadine tablet 10 mg  1 tab(s) orally once a day    flunisolide nasal spray 25 mcg/inh  1 puff(s) intranasally 2 times a day    capsaicin topical cream 0.075%  1  applied topically 4 times a day    folic acid 0.4 mg oral tablet  1 tab(s) orally once a day    omeprazole 20 mg oral delayed release capsule  1 tab(s) orally once a day x 30 days    acetaminophen-hydrocodone tablet 325 mg-5 mg  1 tab(s) orally every 8 hours PRN     coumadin 3 mg oral tablet  1 tab(s) orally once a day- start from 04/25/14.   glipizide xl 2.5 mg oral tablet, extended release  1 tab(s) orally once a day   furosemide 20 mg oral tablet  1 tab(s) orally once a day     Physician's Instructions:  Dressing Care A dry bandage has been applied to your wound.  Keep this bandage clean and dry.   Home Oxygen? Yes   Oxygen delivery at home: 2L  Nasal Cannula   Diet Low Sodium  Low Fat, Low Cholesterol  Carbohydrate Controlled (ADA) Diet   Activity Limitations As tolerated   Return to Work Not Applicable   Time frame for Follow Up Appointment 1-2 weeks   Other Comments follow with nephrologist in 1 week,  follow cardiologist in 1 week- and check INR and adjust coumadin dose.   Electronic  Signatures: Vaughan Basta (MD)  (Signed 31-Aug-15 15:43)  Authored: ADMISSION DATE AND DIAGNOSIS, CHIEF COMPLAINT/HPI, Allergies, PERTINENT LABS, PERTINENT RADIOLOGY STUDIES, PERTINENT PAST HISTORY, HOSPITAL COURSE, DISCHARGE INSTRUCTIONS HOME MEDS, PATIENT INSTRUCTIONS   Last Updated: 31-Aug-15 15:43 by Vaughan Basta (MD)

## 2014-12-19 NOTE — H&P (Signed)
PATIENT NAME:  Justin Davis, Justin Davis MR#:  [id number] DATE OF BIRTH:  12-05-1945  DATE OF ADMISSION:  04/21/2014  PRIMARY CARE PHYSICIAN: Elizabeth Sauer, MD  REFERRING PHYSICIAN: Dr. Carollee Massed  CHIEF COMPLAINT: Generalized weakness for 4 days.   HISTORY OF PRESENT ILLNESS: A 69 year old Caucasian male with a history of CHF, diabetes and CAD who presented to the ED due to generalized weakness. The patient is alert, awake and oriented, in no acute distress. The patient has been hospitalized recently and treated with an antibiotic for bilateral leg cellulitis. He was discharged from the [facility] recently, but he has had generalized weakness for a few days, unable to get to the wheelchair. The patient denies any fever or chills. No chest pain, palpitations, orthopnea, or nocturnal dyspnea. The patient has leg edema with ulcers. The patient denies any other symptoms. According to Dr. Carollee Massed, since the patient has generalized weakness the patient will be admitted to the hospital then the patient needs subacute rehab placement.   PAST MEDICAL HISTORY: CHF, diabetes, CAD status post AICD, bilateral leg cellulitis.   PAST SURGICAL HISTORY: None.   FAMILY HISTORY: Hypertension, diabetes.   SOCIAL HISTORY: No smoking, alcohol drinking or illicit drugs.   ALLERGIES:  1.  FLUNISOLIDE. 2.  IMDUR. 3.  PERCODAN. 4.  SPIRONOLACTONE.   HOME MEDICATIONS:  1.  Coumadin 5 mg p.o. daily. 2.  Triamcinolone topical 0.1% one application 3 times a day p.r.n. 3.  Rosuvastatin 10 mg p.o. once a day at bedtime. 4.  Omeprazole 20 mg p.o. daily. 5.  Nitroglycerin 0.4 mg one sublingual p.r.n. 6.  Loratadine 10 mg p.o. once a day. 7.  Lisinopril 40 mg p.o. daily. 8.  K-Dur 20 oral tablets 1 tablet once a day. 9.  Glipizide 10 mg p.o. b.i.d.  10.  Gabapentin 300 mg p.o. 3 times a day. 11.  Lasix 40 mg p.o. b.i.d.  12.  Folic acid 0.4 mg p.o. once a day. 13.  Flunisolide nasal spray 1 puff intranasally twice a day.  14.   Colace capsule 1 cap b.i.d. p.r.n.  15.  Capsaicin topical cream one application 4 times a day.  16.  Amiodarone 200 mg p.o. daily.  17.  Allegra-D 12-hour tablets 1 tablet 4 times a day p.r.n.  18.  Albuterol with adapter 90 mcg inhalation 2 puffs b.i.d.  19.  Accupril 81 mg oral tablet 1 tablet once a day. 20.  Acetaminophen/hydrocodone tablet 325 mg/5 mg p.o. every 8 hours p.r.n.   REVIEW OF SYSTEMS: CONSTITUTIONAL: The patient denies any fever or chills. No headache or dizziness but has generalized weakness. EYES: No double vision, blurry vision.  ENT: No postnasal drip, slurred speech or dysphagia.  CARDIOVASCULAR: No chest pain, palpitation, orthopnea or nocturnal dyspnea, but has bilateral leg edema.  PULMONARY: No cough, sputum, shortness of breath or hemoptysis.  GASTROINTESTINAL: No abdominal pain, nausea, vomiting or diarrhea. No melena or bloody stool. GENITOURINARY: No dysuria, hematuria, or incontinence.  SKIN: No rash or jaundice.  NEUROLOGY: No syncope, loss of consciousness or seizure.  ENDOCRINE: No polyuria, polydipsia, heat or cold intolerance.  HEMATOLOGY: No easy bleeding but has bruises due to last hospitalization, possibly due to IV line.  PHYSICAL EXAMINATION: VITAL SIGNS: Temperature 98.1, blood pressure 121/99, pulse 73, O2 saturation 99%.  GENERAL: The patient is alert, awake and oriented, in no acute distress.  HEENT: Pupils round, equal, and reactive to light and accommodation. Very dry oral mucosa. Clear oropharynx. NECK: Supple. No JVD or carotid  bruit. No lymphadenopathy. No thyromegaly.   HEART: S1 and S2, regular rate and rhythm. No murmurs or gallops.  LUNGS: Bilateral air entry. No wheezing but has mild rales in bilateral bases. No use of accessory muscles to breathe.  ABDOMEN: Soft. No distention or tenderness. No organomegaly. Bowel sounds present. Obese.  EXTREMITIES: Bilateral leg edema, 2+. No clubbing or cyanosis. No calf tenderness but has  ulcers on bilateral lower extremities and ankle with mild erythema, but no tenderness. No signs of infection. Ulcer is a stage II. Bilateral pedal pulses present.  SKIN: No rash or jaundice.  NEUROLOGIC: Alert and oriented x3. No focal deficits. Power 2/5. Sensation intact.   DIAGNOSTIC DATA: Chest x-ray showed worsening aeration compared to previous. AICD device grossly in satisfactory position. Marked right hemidiaphragm elevation and vascular congestion worsening from previous.   WBC 8.6, hemoglobin 14.2, platelet 308,000.   Glucose 178, BUN 40, creatinine 2.26, sodium 131, potassium 5.8, chloride 90, bicarb 30.   Albumin 3. INR 2.8. Troponin 0.12.   IMPRESSIONS: 1.  Acute renal failure.  2.  Elevated troponin, possibly due to acute renal failure.  3.  Malignant hypertension. The patient's blood pressure just now was 148/115.  4.  Hyperkalemia.  5.  Hyponatremia.  6.  Bilateral leg ulcers.  7.  Thrombocytopenia.  8.  History of congestive heart failure.  9.  Coronary artery disease. 10.  Diabetes.   PLAN OF TREATMENT: 1.  The patient will be admitted to medical floor with telemonitor. We will hold lisinopril and Lasix due to acute renal failure. We will give gentle rehydration with normal saline. Follow BMP. Get a kidney ultrasound and nephrology consult.  2.  For hyperkalemia, will give Kayexalate 30 grams, 1 dose, and followup potassium level.  3.  For elevated troponin, which is possibly due to acute renal failure, we will continue Coumadin, nitroglycerin p.r.n., statin and amiodarone. We will get an echocardiogram and follow up cardiology consult.  4.  For hypertension, we will hold lisinopril and Lasix, give hydralazine p.r.n. 5.  For diabetes, we will start sliding scale, check hemoglobin A1c. 6.  We will get a physical therapy consult and social worker consult for subacute rehab placement.  7.  We will get a wound care consultation for bilateral lower extremity ulcers.   I  discussed the patient's condition and plan of treatment with the patient. The patient wants DNR.  TIME SPENT: About 63 minutes.   ____________________________ Shaune Pollack, MD qc:sb D: 04/21/2014 16:39:50 ET T: 04/21/2014 17:07:25 ET JOB#: [id number]  cc: Shaune Pollack, MD, <Dictator> Shaune Pollack MD ELECTRONICALLY SIGNED 04/22/2014 20:32

## 2015-06-12 IMAGING — US US RENAL KIDNEY
1 series · 14 of 25 positions shown · non-contrast
Comparison: None.

CLINICAL DATA: Acute renal failure.

EXAM:
RENAL/URINARY TRACT ULTRASOUND COMPLETE

[Series 1: us renal kidney · 0.25mm/px · 14 of 35 slices shown]
[im 1/35]
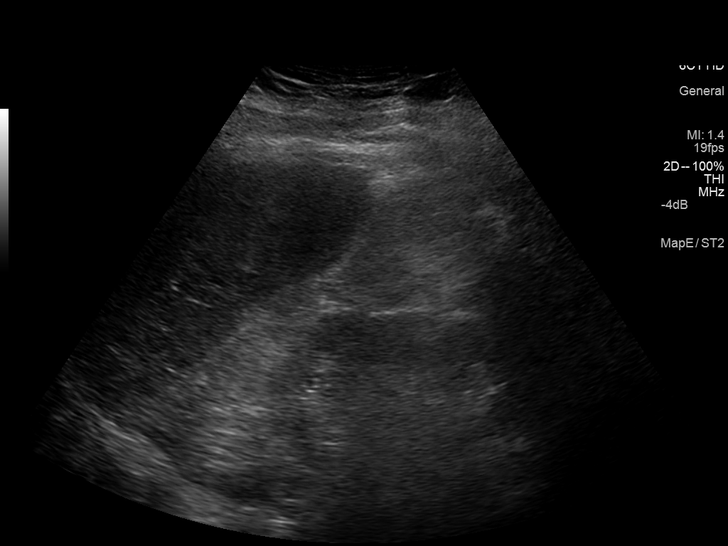
[im 3/35]
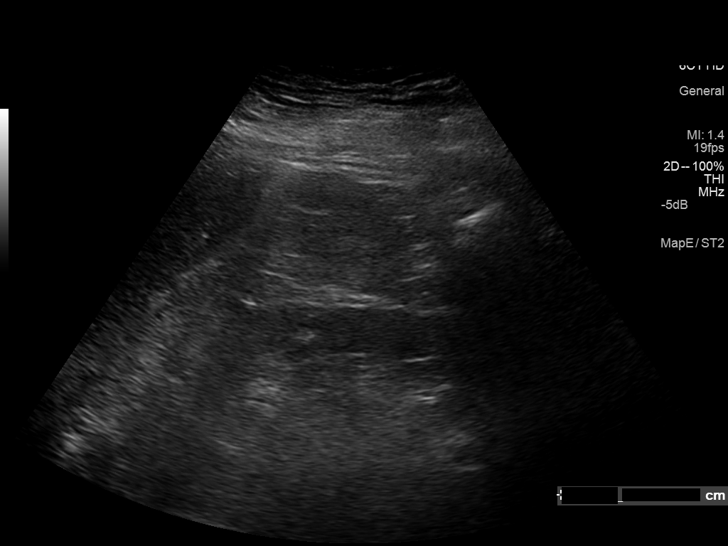
[im 6/35]
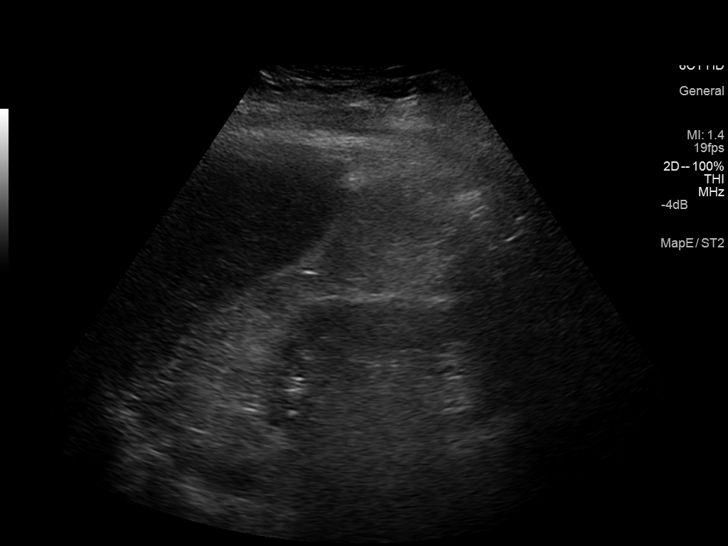
[im 9/35]
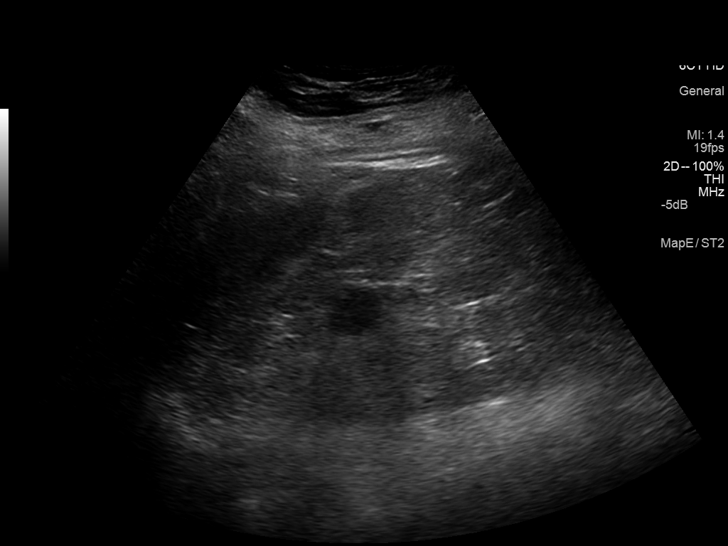
[im 12/35]
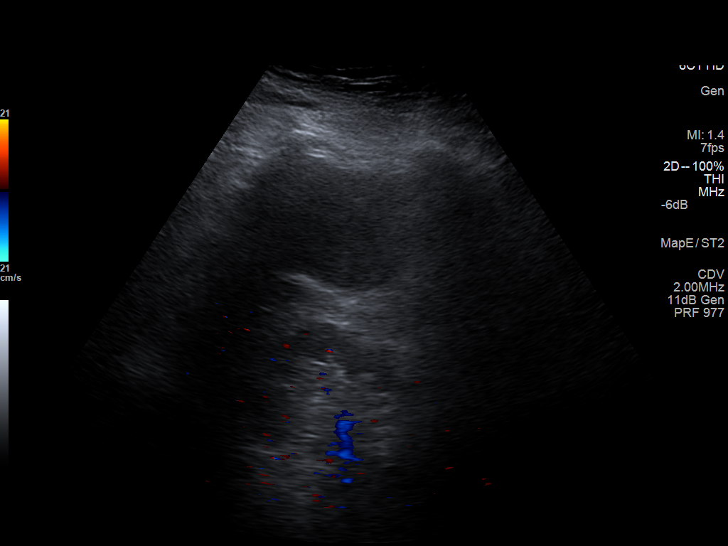
[im 13/35]
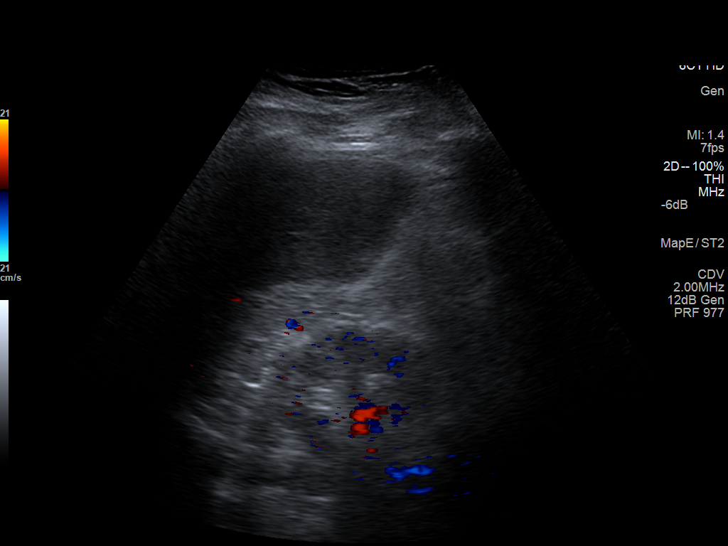
[im 16/35]
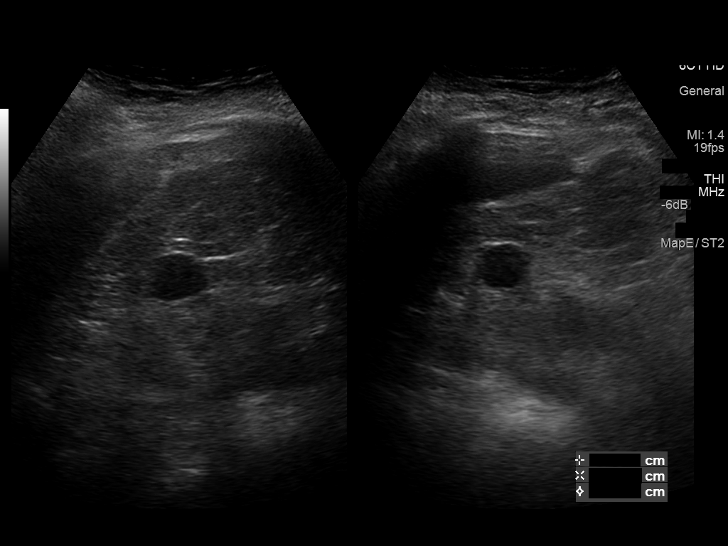
[im 19/35]
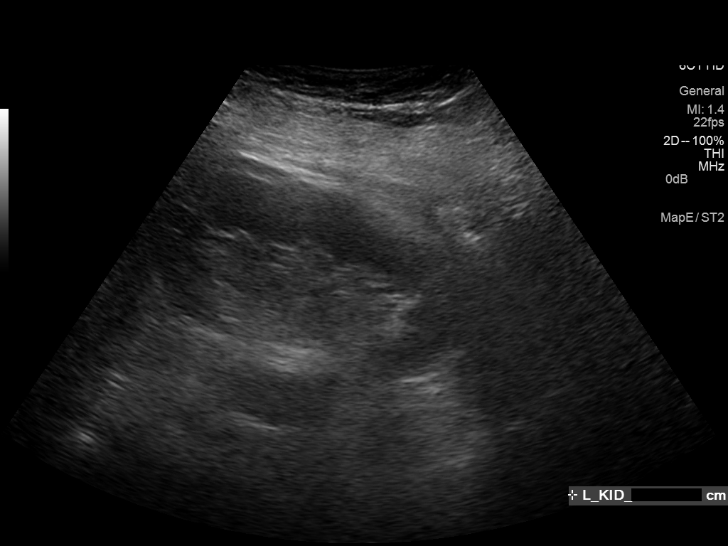
[im 22/35]
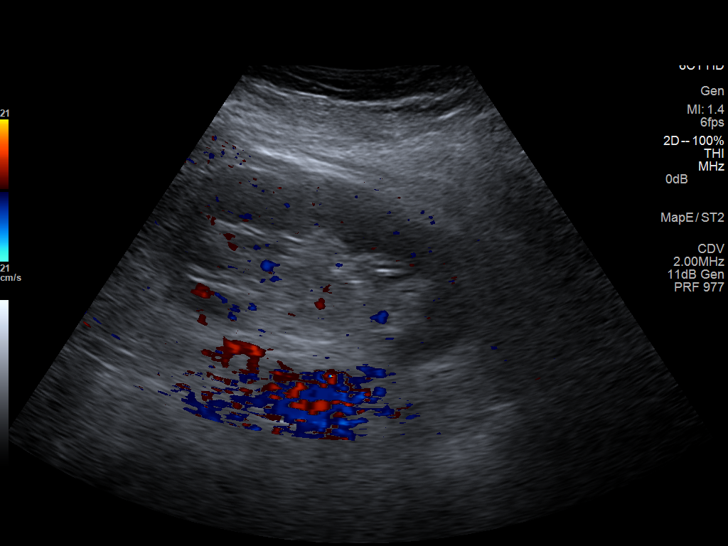
[im 23/35]
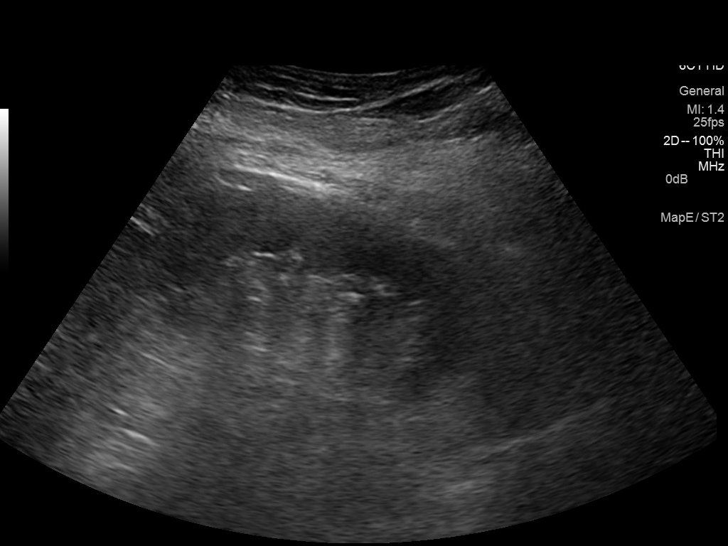
[im 26/35]
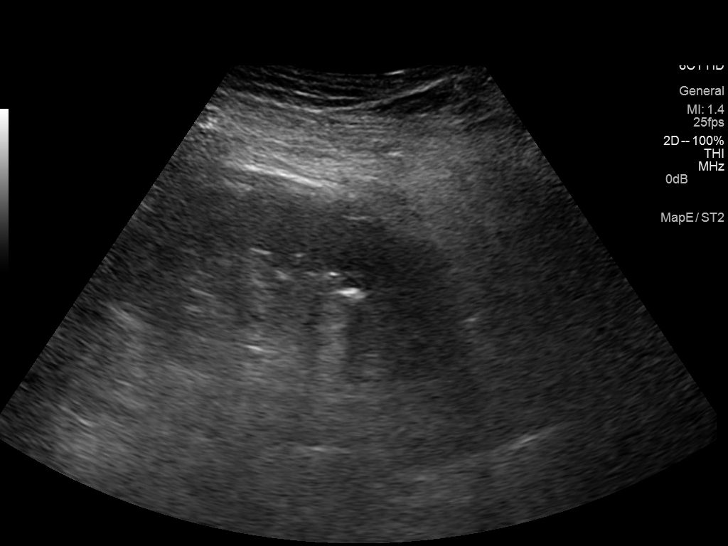
[im 29/35]
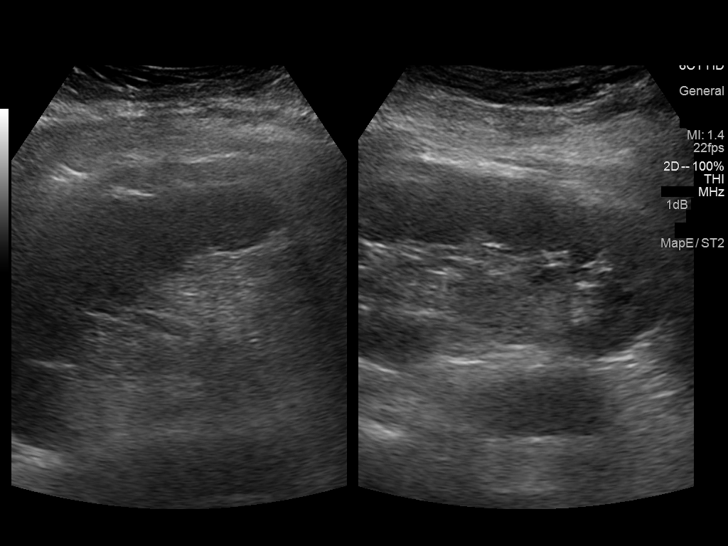
[im 32/35]
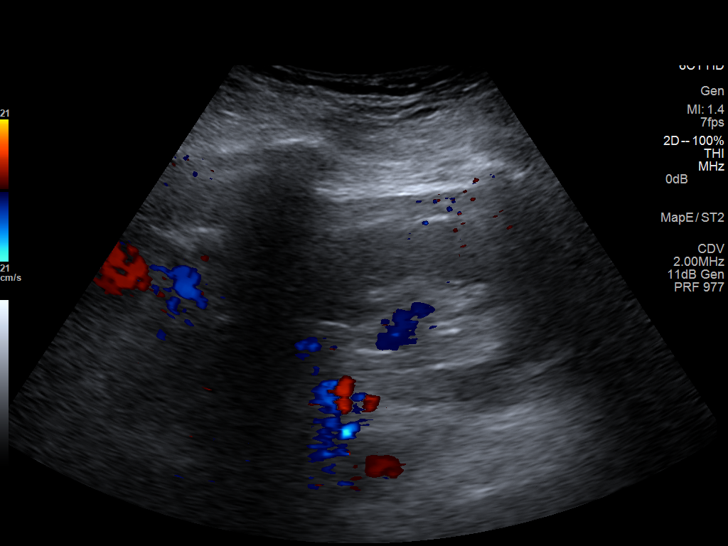
[im 35/35]
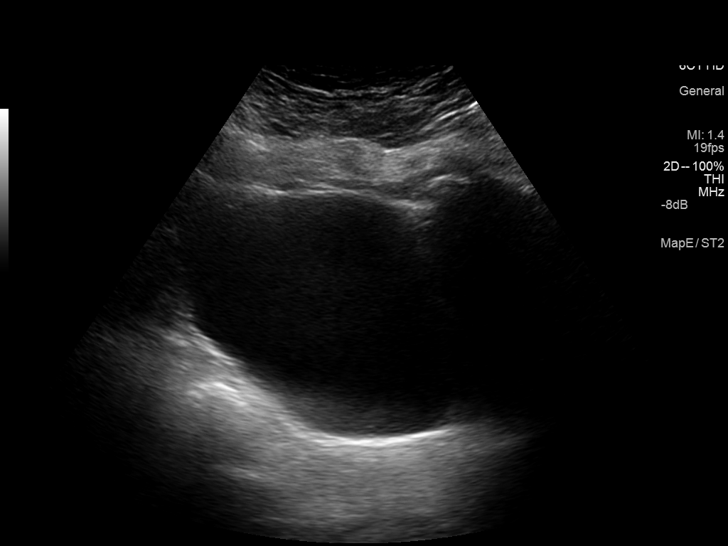

[14 of 25 positions shown; findings below may reference images not displayed]

FINDINGS: Right Kidney:

Length: 10.4 cm.. Slightly increased echogenicity. Exophytic cyst
projects posteriorly from the mid pole of the kidney measuring 2.2 x
1.9 x 2.0 cm.

Left Kidney:

Length: 9.8 cm. Multiple echogenic foci within the kidney cast
acoustic shadows consistent with stones. The largest is in the lower
pole measuring 6 mm in length. Parenchymal echogenicity within
normal limits. No renal mass. No hydronephrosis..

Bladder:

Appears normal for degree of bladder distention.
IMPRESSION: No hydronephrosis. Slightly increased echogenicity of the RIGHT
kidney. LEFT nephrolithiasis.

## 2015-07-02 IMAGING — CR DG CHEST 1V PORT
1 series · 1 of 1 positions shown · non-contrast
Comparison: Chest radiograph 04/21/2014

CLINICAL DATA: Multiple syncopal episodes.

EXAM:
PORTABLE CHEST - 1 VIEW

[portable]
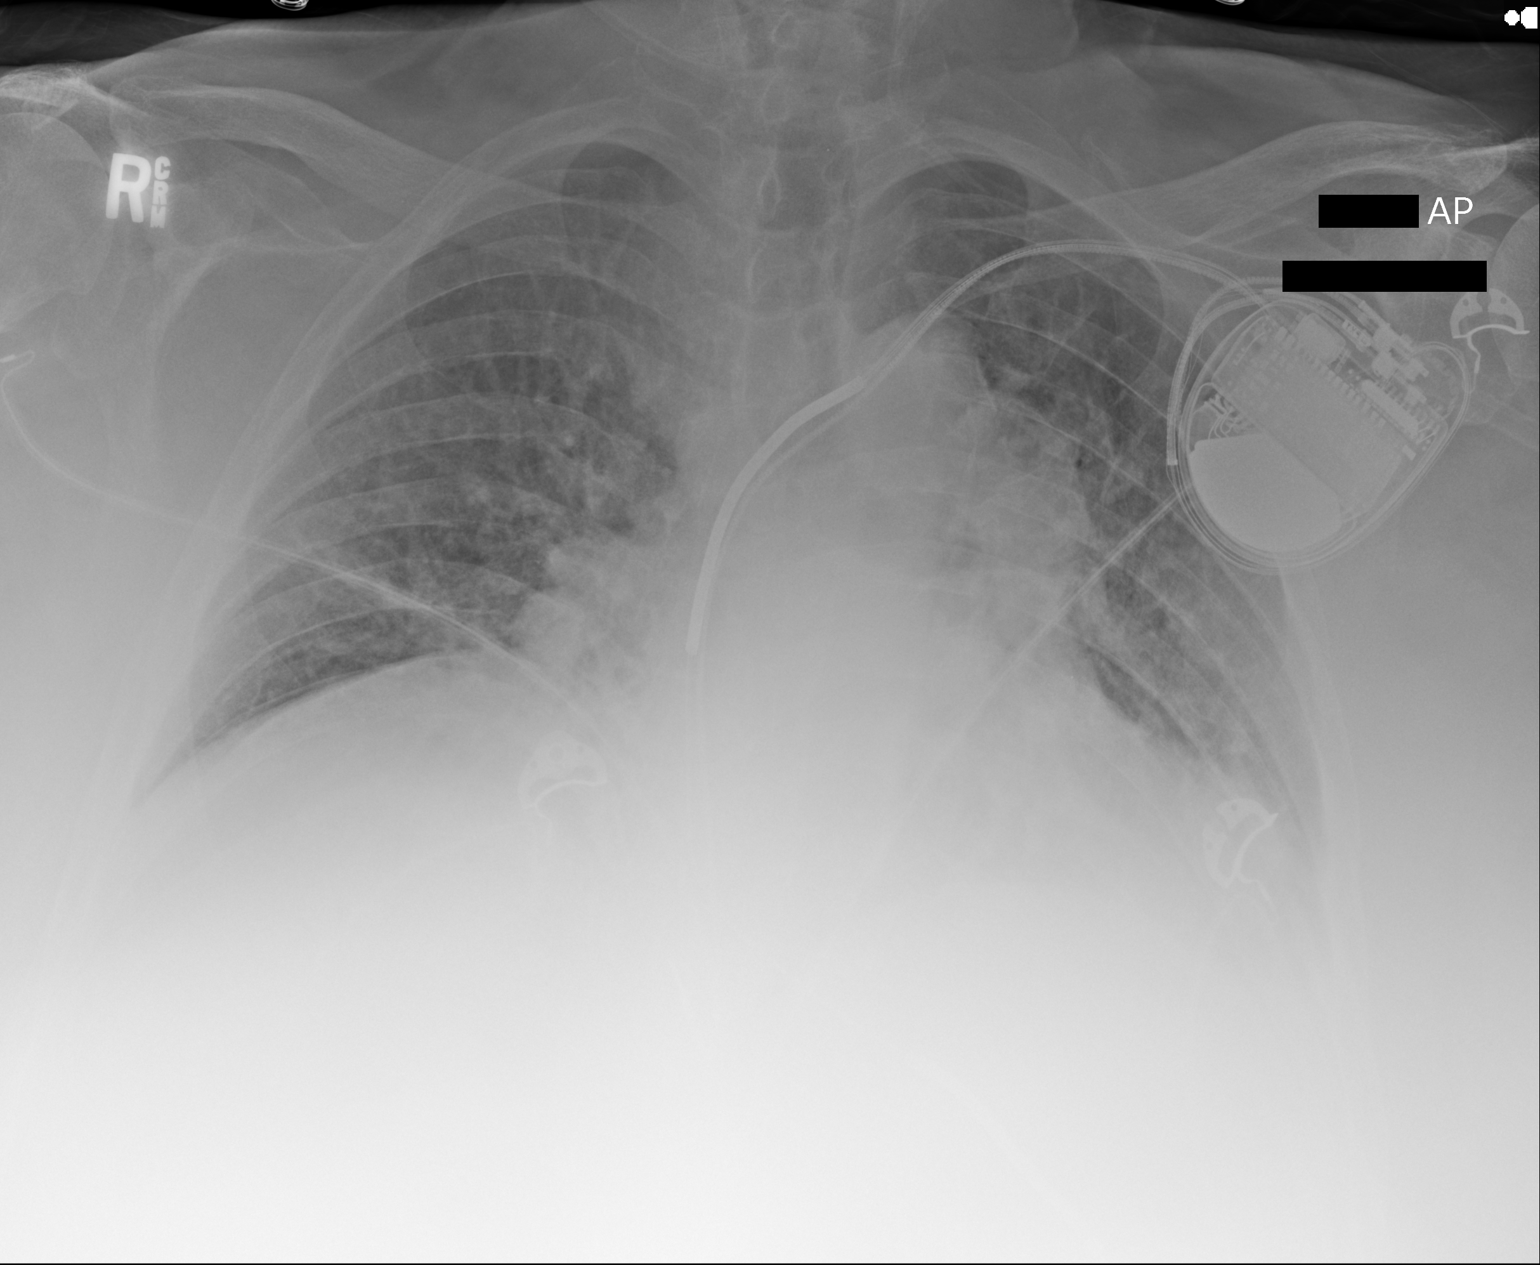

[1 of 1 positions shown; findings below may reference images not displayed]

FINDINGS: Left-sided pacemaker overlies stable enlarged cardiac silhouette.
Low lung volumes similar prior. Central venous congestion is
present.
IMPRESSION: Mild increase in central pulmonary venous congestion. Cardiomegaly
and low lung volumes similar prior.
